# Patient Record
Sex: Female | Born: 1990 | Race: Black or African American | Hispanic: No | Marital: Single | Smoking: Never smoker
Health system: Southern US, Community
[De-identification: ages and names within clinical notes are randomized; demographics above are authoritative.]

## PROBLEM LIST (undated history)

## (undated) ENCOUNTER — Inpatient Hospital Stay (HOSPITAL_COMMUNITY): Payer: Self-pay

## (undated) DIAGNOSIS — O2343 Unspecified infection of urinary tract in pregnancy, third trimester: Secondary | ICD-10-CM

## (undated) DIAGNOSIS — B951 Streptococcus, group B, as the cause of diseases classified elsewhere: Secondary | ICD-10-CM

## (undated) DIAGNOSIS — O365990 Maternal care for other known or suspected poor fetal growth, unspecified trimester, not applicable or unspecified: Secondary | ICD-10-CM

## (undated) DIAGNOSIS — O98213 Gonorrhea complicating pregnancy, third trimester: Secondary | ICD-10-CM

## (undated) HISTORY — DX: Gonorrhea complicating pregnancy, third trimester: O98.213

## (undated) HISTORY — DX: Maternal care for other known or suspected poor fetal growth, unspecified trimester, not applicable or unspecified: O36.5990

## (undated) HISTORY — DX: Unspecified infection of urinary tract in pregnancy, third trimester: O23.43

## (undated) HISTORY — DX: Streptococcus, group b, as the cause of diseases classified elsewhere: B95.1

## (undated) HISTORY — PX: NO PAST SURGERIES: SHX2092

---

## 2013-08-10 NOTE — L&D Delivery Note (Signed)
Delivery Note At 11:45 PM a viable and healthy female was delivered via Vaginal, Spontaneous Delivery (Presentation:ROA).  Baby vigorously crying at delivery.   APGAR: 9 & 9 , ; weight pending .   Placenta status: Intact, Spontaneous.  Cord:  with the following complications: None.    Anesthesia: Epidural  Episiotomy: None Lacerations:  2nd degree Suture Repair: 3.0 monocryl Est. Blood Loss (mL):  300  Mom to postpartum.  Baby to Couplet care / Skin to Skin.  Rochele PagesKARIM, Ily Denno N 07/02/2014, 12:08 AM

## 2013-09-28 ENCOUNTER — Encounter: Payer: Self-pay | Admitting: Obstetrics & Gynecology

## 2013-09-28 ENCOUNTER — Ambulatory Visit (INDEPENDENT_AMBULATORY_CARE_PROVIDER_SITE_OTHER): Payer: Managed Care, Other (non HMO) | Admitting: Obstetrics & Gynecology

## 2013-09-28 VITALS — BP 109/70 | HR 86 | Temp 98.9°F | Ht 67.0 in | Wt 124.0 lb

## 2013-09-28 DIAGNOSIS — B9689 Other specified bacterial agents as the cause of diseases classified elsewhere: Secondary | ICD-10-CM

## 2013-09-28 DIAGNOSIS — A499 Bacterial infection, unspecified: Secondary | ICD-10-CM

## 2013-09-28 DIAGNOSIS — N760 Acute vaginitis: Secondary | ICD-10-CM

## 2013-09-28 LAB — POCT WET PREP (WET MOUNT): Clue Cells Wet Prep Whiff POC: POSITIVE

## 2013-09-28 MED ORDER — METRONIDAZOLE 500 MG PO TABS: 500.0000 mg | ORAL_TABLET | Freq: Two times a day (BID) | ORAL | Status: DC

## 2013-09-28 NOTE — Progress Notes (Signed)
Subjective:     Michele Torres is a 23 y.o. female here for a routine exam.  Current complaints: Patient in office today for a problem visit. Patient states she is having vaginal odor and white/mucous/yellow, thick/ watery discharge. Patient denies any itching, irritation, or burning. Patient states she is not currently trying to having a baby but thinks that she may having some fertility issues.  Personal health questionnaire reviewed: yes.   Gynecologic History Patient's last menstrual period was 09/13/2013. Contraception: None Last Pap: N/A.  Obstetric History OB History  Gravida Para Term Preterm AB SAB TAB Ectopic Multiple Living  0 0 0 0 0 0 0 0 0 0          The following portions of the patient's history were reviewed and updated as appropriate: allergies, current medications, past family history, past medical history, past social history, past surgical history and problem list.  Review of Systems Pertinent items are noted in HPI.    Objective:     Pelvic: thin, white discharge     Assessment:    Bacterial vaginosis   Plan:   Metronidazole Return prn

## 2013-09-28 NOTE — Patient Instructions (Signed)

## 2013-11-24 ENCOUNTER — Inpatient Hospital Stay (HOSPITAL_COMMUNITY)
Admission: AD | Admit: 2013-11-24 | Discharge: 2013-11-24 | Disposition: A | Discharge status: Home / Self Care | Payer: Managed Care, Other (non HMO) | Source: Ambulatory Visit | Attending: Obstetrics | Admitting: Obstetrics

## 2013-11-24 ENCOUNTER — Encounter (HOSPITAL_COMMUNITY): Payer: Self-pay | Admitting: *Deleted

## 2013-11-24 DIAGNOSIS — B9689 Other specified bacterial agents as the cause of diseases classified elsewhere: Secondary | ICD-10-CM | POA: Diagnosis not present

## 2013-11-24 DIAGNOSIS — O2390 Unspecified genitourinary tract infection in pregnancy, unspecified trimester: Secondary | ICD-10-CM | POA: Diagnosis not present

## 2013-11-24 DIAGNOSIS — O219 Vomiting of pregnancy, unspecified: Secondary | ICD-10-CM

## 2013-11-24 DIAGNOSIS — A499 Bacterial infection, unspecified: Secondary | ICD-10-CM | POA: Diagnosis not present

## 2013-11-24 DIAGNOSIS — O99330 Smoking (tobacco) complicating pregnancy, unspecified trimester: Secondary | ICD-10-CM | POA: Insufficient documentation

## 2013-11-24 DIAGNOSIS — N760 Acute vaginitis: Secondary | ICD-10-CM | POA: Diagnosis not present

## 2013-11-24 DIAGNOSIS — O210 Mild hyperemesis gravidarum: Secondary | ICD-10-CM | POA: Diagnosis present

## 2013-11-24 LAB — WET PREP, GENITAL
Trich, Wet Prep: NONE SEEN
Yeast Wet Prep HPF POC: NONE SEEN

## 2013-11-24 LAB — URINALYSIS, ROUTINE W REFLEX MICROSCOPIC
Bilirubin Urine: NEGATIVE
Glucose, UA: NEGATIVE mg/dL
Hgb urine dipstick: NEGATIVE
Ketones, ur: 15 mg/dL — AB
LEUKOCYTES UA: NEGATIVE
NITRITE: NEGATIVE
PH: 6.5 (ref 5.0–8.0)
Protein, ur: NEGATIVE mg/dL
SPECIFIC GRAVITY, URINE: 1.025 (ref 1.005–1.030)
UROBILINOGEN UA: 1 mg/dL (ref 0.0–1.0)

## 2013-11-24 LAB — POCT PREGNANCY, URINE: PREG TEST UR: POSITIVE — AB

## 2013-11-24 MED ORDER — PROMETHAZINE HCL 25 MG PO TABS
12.5000 mg | ORAL_TABLET | Freq: Once | ORAL | Status: AC
  Administered 2013-11-24: 12.5 mg via ORAL
  Filled 2013-11-24: qty 1

## 2013-11-24 MED ORDER — PROMETHAZINE HCL 12.5 MG PO TABS: 12.5000 mg | ORAL_TABLET | Freq: Four times a day (QID) | ORAL | Status: DC | PRN

## 2013-11-24 MED ORDER — METRONIDAZOLE 500 MG PO TABS: 500.0000 mg | ORAL_TABLET | Freq: Two times a day (BID) | ORAL | Status: DC

## 2013-11-24 NOTE — MAU Note (Signed)
Patient states she has had a positive home pregnancy test . States she has had nausea with occasional vomiting for about one week. Denies bleeding. Has slight vaginal discharge with slight odor.

## 2013-11-24 NOTE — Discharge Instructions (Signed)
Bacterial Vaginosis Bacterial vaginosis is an infection of the vagina. It happens when too many of certain germs (bacteria) grow in the vagina. HOME CARE  Take your medicine as told by your doctor.  Finish your medicine even if you start to feel better.  Do not have sex until you finish your medicine and are better.  Tell your sex partner that you have an infection. They should see their doctor for treatment.  Practice safe sex. Use condoms. Have only one sex partner. GET HELP IF:  You are not getting better after 3 days of treatment.  You have more grey fluid (discharge) coming from your vagina than before.  You have more pain than before.  You have a fever. MAKE SURE YOU:   Understand these instructions.  Will watch your condition.  Will get help right away if you are not doing well or get worse. Document Released: 05/05/2008 Document Revised: 05/17/2013 Document Reviewed: 03/08/2013 [facility] Patient Information 2014 [place], [place]. Pregnancy, First Trimester The first trimester is the first 3 months your baby is growing inside you. It is important to follow your doctor's instructions. HOME CARE   Do not smoke.  Do not drink alcohol.  Only take medicine as told by your doctor.  Exercise.  Eat healthy foods. Eat regular, well-balanced meals.  You can have sex (intercourse) if there are no other problems with the pregnancy.  Things that help with morning sickness:  Eat soda crackers before getting up in the morning.  Eat 4 to 5 small meals rather than 3 large meals.  Drink liquids between meals, not during meals.  Go to all appointments as told.  Take all vitamins or supplements as told by your doctor. GET HELP RIGHT AWAY IF:   You develop a fever.  You have a bad smelling fluid that is leaking from your vagina.  There is bleeding from the vagina.  You develop severe belly (abdominal) or back pain.  You throw up (vomit) blood. It may look like  coffee grounds.  You lose more than 2 pounds in a week.  You gain 5 pounds or more in a week.  You gain more than 2 pounds in a week and you see puffiness (swelling) in your feet, ankles, or legs.  You have severe dizziness or pass out (faint).  You are around people who have [place] measles, chickenpox, or fifth disease.  You have a headache, watery poop (diarrhea), pain with peeing (urinating), or cannot breath right. Document Released: 01/13/2008 Document Revised: 10/19/2011 Document Reviewed: 01/13/2008 [facility] Patient Information 2014 [place]ExitCare, [place]LL[place]. Hyperemesis Gravidarum Diet Hyperemesis gravidarum is a severe form of morning sickness. It is characterized by frequent and severe vomiting. It happens during the first trimester of pregnancy. It may be caused by the rapid hormone changes that happen during pregnancy. It is associated with a 5% weight loss of pre-pregnancy weight. The hyperemesis diet may be used to lessen symptoms of nausea and vomiting. EATING GUIDELINES  Eat 5 to 6 small meals daily instead of 3 large meals.  Avoid foods with strong smells.  Avoid drinking 30 minutes before and after meals.  Avoid fried or high-fat foods, such as butter and cream sauces.  Starchy foods are usually well-tolerated, such as cereal, toast, bread, potatoes, pasta, rice, and pretzels.  Eat crackers before you get out of bed in the morning.  Avoid spicy foods.  Ginger may help with nausea. Add  tsp ginger to hot tea or choose ginger tea.  Continue to  take your prenatal vitamins as directed by your caregiver. SAMPLE MEAL PLAN Breakfast    cup oatmeal  1 slice toast  1 tsp heart-healthy margarine  1 tsp jelly  1 scrambled egg Midmorning Snack   1 cup low-fat yogurt Lunch   Plain ham sandwich  Carrot or celery sticks  1 small apple  3 graham crackers Midafternoon Snack   Cheese and crackers Dinner  4 oz pork tenderloin  1 small baked potato  1 tsp  margarine   cup broccoli   cup grapes Evening Snack  1 cup pudding Document Released: 05/24/2007 Document Revised: 10/19/2011 Document Reviewed: 12/27/2012 ExitCare Patient Information 2014 [place]Care, [place].

## 2013-11-24 NOTE — MAU Provider Note (Signed)
History     CSN: [id number]  Arrival date and time: 11/24/13 1636   First Provider Initiated Contact with Patient 11/24/13 1816      Chief Complaint  Patient presents with  . Possible Pregnancy  . Nausea  . Vaginal Discharge   HPI Ms. Michele Torres is a 23 y.o. G1P0000 at 324w1d who presents to MAU today with complaint of N/V and vaginal discharge. She states she is unsure of her LMP but may have been in early February. She states she has noted a small amount of thin, white vaginal discharge with mild odor. She has also had frequent nausea with occasional vomiting. She denies abdominal pain or vaginal bleeding.   OB History   Grav Para Term Preterm Abortions TAB SAB Ect Mult Living   1 0 0 0 0 0 0 0 0 0       History reviewed. No pertinent past medical history.  History reviewed. No pertinent past surgical history.  Family History  Problem Relation Age of Onset  . Mental illness Maternal Grandmother     alzheimers    History  Substance Use Topics  . Smoking status: Current Some Day Smoker  . Smokeless tobacco: Never Used  . Alcohol Use: No    Allergies: No Known Allergies  Prescriptions prior to admission  Medication Sig Dispense Refill  . naproxen sodium (ANAPROX) 220 MG tablet Take 220 mg by mouth as needed (headache).        Review of Systems  Constitutional: Negative for fever.  Gastrointestinal: Positive for nausea and vomiting. Negative for abdominal pain, diarrhea and constipation.  Genitourinary:       Neg - vaginal bleeding + vaginal discharge   Physical Exam   Blood pressure 125/57, pulse 72, temperature 98.5 F (36.9 C), temperature source Oral, resp. rate 16, height 5\' 7"  (1.702 m), weight 54.341 kg (119 lb 12.8 oz), last menstrual period 09/14/2013, SpO2 100.00%.  Physical Exam  Constitutional: She is oriented to person, place, and time. She appears well-developed and well-nourished. No distress.  HENT:  Head: Normocephalic and atraumatic.   Cardiovascular: Normal rate.   Respiratory: Effort normal.  GI: Soft. She exhibits no distension and no mass. There is no tenderness. There is no rebound and no guarding.  Genitourinary: No bleeding around the vagina. Vaginal discharge (scant thin, white discharge noted) found.  Neurological: She is alert and oriented to person, place, and time.  Skin: Skin is warm and dry. No erythema.  Psychiatric: She has a normal mood and affect.   Results for orders placed during the hospital encounter of 11/24/13 (from the past 24 hour(s))  URINALYSIS, ROUTINE W REFLEX MICROSCOPIC     Status: Abnormal   Collection Time    11/24/13  5:10 PM      Result Value Ref Range   Color, Urine YELLOW  YELLOW   APPearance CLEAR  CLEAR   Specific Gravity, Urine 1.025  1.005 - 1.030   pH 6.5  5.0 - 8.0   Glucose, UA NEGATIVE  NEGATIVE mg/dL   Hgb urine dipstick NEGATIVE  NEGATIVE   Bilirubin Urine NEGATIVE  NEGATIVE   Ketones, ur 15 (*) NEGATIVE mg/dL   Protein, ur NEGATIVE  NEGATIVE mg/dL   Urobilinogen, UA 1.0  0.0 - 1.0 mg/dL   Nitrite NEGATIVE  NEGATIVE   Leukocytes, UA NEGATIVE  NEGATIVE  POCT PREGNANCY, URINE     Status: Abnormal   Collection Time    11/24/13  5:31 PM  Result Value Ref Range   Preg Test, Ur POSITIVE (*) NEGATIVE  WET PREP, GENITAL     Status: Abnormal   Collection Time    11/24/13  6:15 PM      Result Value Ref Range   Yeast Wet Prep HPF POC NONE SEEN  NONE SEEN   Trich, Wet Prep NONE SEEN  NONE SEEN   Clue Cells Wet Prep HPF POC FEW (*) NONE SEEN   WBC, Wet Prep HPF POC FEW (*) NONE SEEN    MAU Course  Procedures None  MDM + UPT UA, wet prep and GC/Chlamydia today 12.5 mg PO Phenergan given in MAU - patient reports improvement in symptoms  Assessment and Plan  A: Nausea and vomiting in pregnancy prior to [redacted] weeks gestation Bacterial vaginosis  P: Discharge home Rx for Flagyl and Phenergan sent to patient's pharmacy Pregnancy confirmation letter  given Patient given information about hyperemesis diet on AVS Encouraged increased PO hydration as tolerated First trimester warning signs discussed Patient advised to make an appointment with [facility] ASAP to start prenatal care Patient may return to MAU as needed or if her condition were to change or worsen  Freddi StarrJulie N Ethier, PA-C  11/24/2013, 6:49 PM

## 2013-11-25 LAB — GC/CHLAMYDIA PROBE AMP
CT Probe RNA: NEGATIVE
GC Probe RNA: NEGATIVE

## 2013-11-27 ENCOUNTER — Encounter (HOSPITAL_COMMUNITY): Payer: Self-pay | Admitting: *Deleted

## 2013-11-27 ENCOUNTER — Inpatient Hospital Stay (HOSPITAL_COMMUNITY)
Admission: AD | Admit: 2013-11-27 | Discharge: 2013-11-27 | Disposition: A | Discharge status: Home / Self Care | Payer: Managed Care, Other (non HMO) | Source: Ambulatory Visit | Attending: Obstetrics | Admitting: Obstetrics

## 2013-11-27 DIAGNOSIS — O99330 Smoking (tobacco) complicating pregnancy, unspecified trimester: Secondary | ICD-10-CM | POA: Insufficient documentation

## 2013-11-27 DIAGNOSIS — O210 Mild hyperemesis gravidarum: Secondary | ICD-10-CM | POA: Diagnosis present

## 2013-11-27 DIAGNOSIS — O219 Vomiting of pregnancy, unspecified: Secondary | ICD-10-CM

## 2013-11-27 LAB — URINALYSIS, ROUTINE W REFLEX MICROSCOPIC
Bilirubin Urine: NEGATIVE
GLUCOSE, UA: NEGATIVE mg/dL
Hgb urine dipstick: NEGATIVE
Ketones, ur: 15 mg/dL — AB
Nitrite: NEGATIVE
PH: 6 (ref 5.0–8.0)
Protein, ur: 30 mg/dL — AB
SPECIFIC GRAVITY, URINE: 1.02 (ref 1.005–1.030)
Urobilinogen, UA: 1 mg/dL (ref 0.0–1.0)

## 2013-11-27 LAB — URINE MICROSCOPIC-ADD ON

## 2013-11-27 MED ORDER — ONDANSETRON HCL 4 MG PO TABS: 4.0000 mg | ORAL_TABLET | Freq: Four times a day (QID) | ORAL | Status: DC

## 2013-11-27 MED ORDER — ONDANSETRON 8 MG PO TBDP
8.0000 mg | ORAL_TABLET | Freq: Once | ORAL | Status: AC
  Administered 2013-11-27: 8 mg via ORAL
  Filled 2013-11-27: qty 1

## 2013-11-27 NOTE — MAU Provider Note (Signed)
History     CSN: [id number]  Arrival date and time: 11/27/13 1113   First Provider Initiated Contact with Patient 11/27/13 1131      Chief Complaint  Patient presents with  . Morning Sickness   HPI Michele Torres is a 23 y.o. G1P0000 at 6466w4d who presents to MAU today with complaint of N/V. The patient states that she has continued to have frequent nausea and occasional vomiting. She states she has had more vomiting today because she was unable to take the Phenergan because of the drowsiness and she had a test at school. She denies abdominal pain, vaginal bleeding or fever.   OB History   Grav Para Term Preterm Abortions TAB SAB Ect Mult Living   1 0 0 0 0 0 0 0 0 0       History reviewed. No pertinent past medical history.  History reviewed. No pertinent past surgical history.  Family History  Problem Relation Age of Onset  . Mental illness Maternal Grandmother     alzheimers    History  Substance Use Topics  . Smoking status: Current Some Day Smoker  . Smokeless tobacco: Never Used  . Alcohol Use: No    Allergies: No Known Allergies  Prescriptions prior to admission  Medication Sig Dispense Refill  . metroNIDAZOLE (FLAGYL) 500 MG tablet Take 1 tablet (500 mg total) by mouth 2 (two) times daily.  14 tablet  0  . promethazine (PHENERGAN) 12.5 MG tablet Take 1 tablet (12.5 mg total) by mouth every 6 (six) hours as needed for nausea or vomiting.  30 tablet  0    Review of Systems  Constitutional: Negative for fever and malaise/fatigue.  Gastrointestinal: Positive for nausea and vomiting. Negative for abdominal pain.  Genitourinary:       Neg -vaginal bleeding  Neurological: Positive for weakness. Negative for dizziness.   Physical Exam   Blood pressure 128/67, pulse 72, temperature 98.4 F (36.9 C), resp. rate 18, height 5\' 7"  (1.702 m), weight 54.432 kg (120 lb), last menstrual period 09/14/2013.  Physical Exam  Constitutional: She is oriented to  person, place, and time. She appears well-developed and well-nourished. No distress.  HENT:  Head: Normocephalic and atraumatic.  Cardiovascular: Normal rate.   Respiratory: Effort normal.  GI: Soft. She exhibits no distension and no mass. There is no tenderness. There is no rebound and no guarding.  Neurological: She is alert and oriented to person, place, and time.  Skin: Skin is warm and dry. No erythema.  Psychiatric: She has a normal mood and affect.   Results for orders placed during the hospital encounter of 11/27/13 (from the past 24 hour(s))  URINALYSIS, ROUTINE W REFLEX MICROSCOPIC     Status: Abnormal   Collection Time    11/27/13 11:21 AM      Result Value Ref Range   Color, Urine YELLOW  YELLOW   APPearance CLEAR  CLEAR   Specific Gravity, Urine 1.020  1.005 - 1.030   pH 6.0  5.0 - 8.0   Glucose, UA NEGATIVE  NEGATIVE mg/dL   Hgb urine dipstick NEGATIVE  NEGATIVE   Bilirubin Urine NEGATIVE  NEGATIVE   Ketones, ur 15 (*) NEGATIVE mg/dL   Protein, ur 30 (*) NEGATIVE mg/dL   Urobilinogen, UA 1.0  0.0 - 1.0 mg/dL   Nitrite NEGATIVE  NEGATIVE   Leukocytes, UA TRACE (*) NEGATIVE  URINE MICROSCOPIC-ADD ON     Status: None   Collection Time    11/27/13 11:21  AM      Result Value Ref Range   Squamous Epithelial / LPF RARE  RARE   WBC, UA 0-2  <3 WBC/hpf   Bacteria, UA RARE  RARE    MAU Course  Procedures None  MDM Discussed risk vs benefits of Zofran. Patient would like to try it today 8 mg ODT Zofran given - patient reports significant improvement in nausea  Assessment and Plan  A: Nausea and vomiting in pregnancy prior to [redacted] weeks gestation  P: Discharge home Rx for Zofran sent to patient's pharmacy Patient advised of Hyperemesis diet Discussed foods to avoid with nausea Patient advised to call [facility] for an appointment to start prenatal care ASAP Patient may return to MAU as needed or if her condition were to change or worsen  Freddi StarrJulie N Ethier, PA-C   11/27/2013, 12:40 PM

## 2013-11-27 NOTE — Discharge Instructions (Signed)
Hyperemesis Gravidarum Diet °Hyperemesis gravidarum is a severe form of morning sickness. It is characterized by frequent and severe vomiting. It happens during the first trimester of pregnancy. It may be caused by the rapid hormone changes that happen during pregnancy. It is associated with a 5% weight loss of pre-pregnancy weight. The hyperemesis diet may be used to lessen symptoms of nausea and vomiting. °EATING GUIDELINES °· Eat 5 to 6 small meals daily instead of 3 large meals. °· Avoid foods with strong smells. °· Avoid drinking 30 minutes before and after meals. °· Avoid fried or high-fat foods, such as butter and cream sauces. °· Starchy foods are usually well-tolerated, such as cereal, toast, bread, potatoes, pasta, rice, and pretzels. °· Eat crackers before you get out of bed in the morning. °· Avoid spicy foods. °· Ginger may help with nausea. Add ¼ tsp ginger to hot tea or choose ginger tea. °· Continue to take your prenatal vitamins as directed by your caregiver. °SAMPLE MEAL PLAN °Breakfast  °· ½ cup oatmeal °· 1 slice toast °· 1 tsp heart-healthy margarine °· 1 tsp jelly °· 1 scrambled egg °Midmorning Snack  °· 1 cup low-fat yogurt °Lunch  °· Plain ham sandwich °· Carrot or celery sticks °· 1 small apple °· 3 graham crackers °Midafternoon Snack  °· Cheese and crackers °Dinner °· 4 oz pork tenderloin °· 1 small baked potato °· 1 tsp margarine °· ½ cup broccoli °· ½ cup grapes °Evening Snack °· 1 cup pudding °Document Released: 05/24/2007 Document Revised: 10/19/2011 Document Reviewed: 12/27/2012 °ExitCare® Patient Information ©2014 ExitCare[employer] LLC. ° °

## 2013-11-27 NOTE — MAU Note (Signed)
Pt presents to MAU for complaints of nausea and vomiting. She says she was given phenergan on Friday when she was evaluated here in MAU.  The medication makes her sleepy and she sleeps but when she wakes up she is still nauseous. She was also given Flagyl for BV and states that may be causing her to be nauseated.

## 2013-12-01 ENCOUNTER — Inpatient Hospital Stay (HOSPITAL_COMMUNITY)
Admission: AD | Admit: 2013-12-01 | Discharge: 2013-12-01 | Disposition: A | Discharge status: Home / Self Care | Payer: Managed Care, Other (non HMO) | Source: Ambulatory Visit | Attending: Obstetrics & Gynecology | Admitting: Obstetrics & Gynecology

## 2013-12-01 ENCOUNTER — Encounter (HOSPITAL_COMMUNITY): Payer: Self-pay | Admitting: *Deleted

## 2013-12-01 DIAGNOSIS — N898 Other specified noninflammatory disorders of vagina: Secondary | ICD-10-CM

## 2013-12-01 DIAGNOSIS — B3731 Acute candidiasis of vulva and vagina: Secondary | ICD-10-CM | POA: Diagnosis not present

## 2013-12-01 DIAGNOSIS — O2390 Unspecified genitourinary tract infection in pregnancy, unspecified trimester: Secondary | ICD-10-CM | POA: Insufficient documentation

## 2013-12-01 DIAGNOSIS — B373 Candidiasis of vulva and vagina: Secondary | ICD-10-CM | POA: Insufficient documentation

## 2013-12-01 DIAGNOSIS — O99330 Smoking (tobacco) complicating pregnancy, unspecified trimester: Secondary | ICD-10-CM | POA: Diagnosis not present

## 2013-12-01 DIAGNOSIS — O26891 Other specified pregnancy related conditions, first trimester: Secondary | ICD-10-CM

## 2013-12-01 LAB — WET PREP, GENITAL
Clue Cells Wet Prep HPF POC: NONE SEEN
Trich, Wet Prep: NONE SEEN

## 2013-12-01 MED ORDER — TERCONAZOLE 0.4 % VA CREA: 1.0000 | TOPICAL_CREAM | Freq: Every day | VAGINAL | Status: DC

## 2013-12-01 NOTE — MAU Note (Signed)
Pt reports she was seen here one week ago and was told she had bacterial vaginosis and given meds but it is getting worse.

## 2013-12-01 NOTE — MAU Provider Note (Signed)
Chief Complaint: Vaginal Discharge   First Provider Initiated Contact with Patient 12/01/13 0308     SUBJECTIVE HPI: Michele Torres is a 23 y.o. G1P0000 at 6558w1d by LMP who presents with possible bacterial vaginosis. States she was seen in maternity admissions one week ago diagnosed with bacterial vaginosis and Flagyl as directed. States discharge never went away. Call office regarding symptoms, but did not call back. Denies fever, chills, vaginal bleeding, vaginal odor, abdominal pain. Vaginal discharge associated with itching and irritation.  History reviewed. No pertinent past medical history. OB History  Gravida Para Term Preterm AB SAB TAB Ectopic Multiple Living  1 0 0 0 0 0 0 0 0 0     # Outcome Date GA Lbr Len/2nd Weight Sex Delivery Anes PTL Lv  1 CUR              History reviewed. No pertinent past surgical history. History   Social History  . Marital Status: Single    Spouse Name: N/A    Number of Children: N/A  . Years of Education: N/A   Occupational History  . Not on file.   Social History Main Topics  . Smoking status: Current Some Day Smoker  . Smokeless tobacco: Never Used  . Alcohol Use: No  . Drug Use: No  . Sexual Activity: Yes    Partners: Male    Birth Control/ Protection: None   Other Topics Concern  . Not on file   Social History Narrative  . No narrative on file   No current facility-administered medications on file prior to encounter.   Current Outpatient Prescriptions on File Prior to Encounter  Medication Sig Dispense Refill  . metroNIDAZOLE (FLAGYL) 500 MG tablet Take 1 tablet (500 mg total) by mouth 2 (two) times daily.  14 tablet  0  . ondansetron (ZOFRAN) 4 MG tablet Take 1 tablet (4 mg total) by mouth every 6 (six) hours.  12 tablet  0  . promethazine (PHENERGAN) 12.5 MG tablet Take 1 tablet (12.5 mg total) by mouth every 6 (six) hours as needed for nausea or vomiting.  30 tablet  0   No Known Allergies  ROS: Pertinent items in  HPI  OBJECTIVE Blood pressure 124/63, pulse 74, temperature 98.2 F (36.8 C), temperature source Oral, resp. rate 16, height 5\' 7"  (1.702 m), weight 56.7 kg (125 lb), last menstrual period 09/14/2013, SpO2 100.00%. GENERAL: Well-developed, well-nourished female in no acute distress.  HEENT: Normocephalic HEART: normal rate RESP: normal effort ABDOMEN: Soft, non-tender EXTREMITIES: Nontender, no edema NEURO: Alert and oriented SPECULUM EXAM: NEFG, large amount of white, curd-like, odorless discharge, no blood noted, cervix clean BIMANUAL: cervix closed; uterus 12-week size, no adnexal tenderness or masses. Fetal heart rate 158 by Doppler.  LAB RESULTS Results for orders placed during the hospital encounter of 12/01/13 (from the past 168 hour(s))  WET PREP, GENITAL   Collection Time    12/01/13  3:05 AM      Result Value Ref Range   Yeast Wet Prep HPF POC FEW (*) NONE SEEN   Trich, Wet Prep NONE SEEN  NONE SEEN   Clue Cells Wet Prep HPF POC NONE SEEN  NONE SEEN   WBC, Wet Prep HPF POC MODERATE (*) NONE SEEN    IMAGING No results found.  MAU COURSE  ASSESSMENT 1. Vaginal yeast infection   2. Vaginal discharge in pregnancy in first trimester    PLAN Discharge home in stable condition. GC/Chlamydia cultures deferred due to negative/negative on  11/24/2013.      Follow-up Information   Follow up with Roseanna RainbowJACKSON-MOORE,LISA A, MD. (As scheduled or as needed if symptoms worsen)    Specialty:  Obstetrics and Gynecology   Contact information:   [address] [place] [address] [phone]       [facility]. (As needed in emergencies)    Contact information:   [address] [id number] [place] [address] [phone]       Medication List    STOP taking these medications       metroNIDAZOLE 500 MG tablet  Commonly known as:  FLAGYL      TAKE these medications        ondansetron 4 MG tablet  Commonly known as:  ZOFRAN  Take 1 tablet (4 mg total) by mouth every 6 (six) hours.     prenatal multivitamin Tabs tablet  Take 1 tablet by mouth daily at 12 noon.     promethazine 12.5 MG tablet  Commonly known as:  PHENERGAN  Take 1 tablet (12.5 mg total) by mouth every 6 (six) hours as needed for nausea or vomiting.     terconazole 0.4 % vaginal cream  Commonly known as:  TERAZOL 7  Place 1 applicator vaginally at bedtime.        [place] Tit[place] Torres, CNM 12/01/2013  3:15 AM

## 2013-12-01 NOTE — Discharge Instructions (Signed)
Candidal Vulvovaginitis  Candidal vulvovaginitis is an infection of the vagina and vulva. The vulva is the skin around the opening of the vagina. This may cause itching and discomfort in and around the vagina.   HOME CARE  · Only take medicine as told by your doctor.  · Do not have sex (intercourse) until the infection is healed or as told by your doctor.  · Practice safe sex.  · Tell your sex partner about your infection.  · Do not douche or use tampons.  · Wear cotton underwear. Do not wear tight pants or panty hose.  · Eat yogurt. This may help treat and prevent yeast infections.  GET HELP RIGHT AWAY IF:   · You have a fever.  · Your problems get worse during treatment or do not get better in 3 days.  · You have discomfort, irritation, or itching in your vagina or vulva area.  · You have pain after sex.  · You start to get belly (abdominal) pain.  MAKE SURE YOU:  · Understand these instructions.  · Will watch your condition.  · Will get help right away if you are not doing well or get worse.  Document Released: 10/23/2008 Document Revised: 10/19/2011 Document Reviewed: 10/23/2008  ExitCare® Patient Information ©2014 ExitCare, LLC.

## 2013-12-07 ENCOUNTER — Other Ambulatory Visit: Payer: Self-pay | Admitting: *Deleted

## 2013-12-07 DIAGNOSIS — O219 Vomiting of pregnancy, unspecified: Secondary | ICD-10-CM

## 2013-12-07 MED ORDER — ONDANSETRON 4 MG PO TBDP: 4.0000 mg | ORAL_TABLET | Freq: Three times a day (TID) | ORAL | Status: DC | PRN

## 2013-12-08 ENCOUNTER — Encounter: Payer: Self-pay | Admitting: Obstetrics

## 2014-05-03 ENCOUNTER — Inpatient Hospital Stay (HOSPITAL_COMMUNITY)
Admission: AD | Admit: 2014-05-03 | Discharge: 2014-05-03 | Disposition: A | Discharge status: Home / Self Care | Payer: Medicaid Other | Source: Ambulatory Visit | Attending: Family Medicine | Admitting: Family Medicine

## 2014-05-03 ENCOUNTER — Encounter (HOSPITAL_COMMUNITY): Payer: Self-pay | Admitting: *Deleted

## 2014-05-03 DIAGNOSIS — O99330 Smoking (tobacco) complicating pregnancy, unspecified trimester: Secondary | ICD-10-CM | POA: Diagnosis not present

## 2014-05-03 DIAGNOSIS — J3489 Other specified disorders of nose and nasal sinuses: Secondary | ICD-10-CM | POA: Diagnosis not present

## 2014-05-03 DIAGNOSIS — J069 Acute upper respiratory infection, unspecified: Secondary | ICD-10-CM

## 2014-05-03 DIAGNOSIS — O212 Late vomiting of pregnancy: Secondary | ICD-10-CM

## 2014-05-03 DIAGNOSIS — O99891 Other specified diseases and conditions complicating pregnancy: Secondary | ICD-10-CM | POA: Insufficient documentation

## 2014-05-03 DIAGNOSIS — O9989 Other specified diseases and conditions complicating pregnancy, childbirth and the puerperium: Principal | ICD-10-CM

## 2014-05-03 MED ORDER — DIPHENHYDRAMINE HCL 25 MG PO TABS: 25.0000 mg | ORAL_TABLET | Freq: Every evening | ORAL | Status: DC | PRN

## 2014-05-03 MED ORDER — PSEUDOEPHEDRINE HCL 60 MG PO TABS: 60.0000 mg | ORAL_TABLET | ORAL | Status: DC | PRN

## 2014-05-03 MED ORDER — SALINE SPRAY 0.65 % NA SOLN: 1.0000 | NASAL | Status: DC | PRN

## 2014-05-03 NOTE — MAU Note (Signed)
Does not have a provider here, recently moved back.

## 2014-05-03 NOTE — MAU Note (Signed)
Has come down with a cold, last few days.  Started throwing up again yesterday. + drainage, gagging her, denies fever or diarrhea.

## 2014-05-03 NOTE — MAU Provider Note (Signed)
Attestation of Attending Supervision of Advanced Practitioner (PA/CNM/NP): Evaluation and management procedures were performed by the Advanced Practitioner under my supervision and collaboration.  I have reviewed the Advanced Practitioner's note and chart, and I agree with the management and plan.  Marsa Matteo S, MD [facility] Attending 05/03/2014 11:06 PM   

## 2014-05-03 NOTE — Discharge Instructions (Signed)
Upper Respiratory Infection, Adult An upper respiratory infection (URI) is also sometimes known as the common cold. The upper respiratory tract includes the nose, sinuses, throat, trachea, and bronchi. Bronchi are the airways leading to the lungs. Most people improve within 1 week, but symptoms can last up to 2 weeks. A residual cough may last even longer.  CAUSES Many different viruses can infect the tissues lining the upper respiratory tract. The tissues become irritated and inflamed and often become very moist. Mucus production is also common. A cold is contagious. You can easily spread the virus to others by oral contact. This includes kissing, sharing a glass, coughing, or sneezing. Touching your mouth or nose and then touching a surface, which is then touched by another person, can also spread the virus. SYMPTOMS  Symptoms typically develop 1 to 3 days after you come in contact with a cold virus. Symptoms vary from person to person. They may include:  Runny nose.  Sneezing.  Nasal congestion.  Sinus irritation.  Sore throat.  Loss of voice (laryngitis).  Cough.  Fatigue.  Muscle aches.  Loss of appetite.  Headache.  Low-grade fever. DIAGNOSIS  You might diagnose your own cold based on familiar symptoms, since most people get a cold 2 to 3 times a year. Your caregiver can confirm this based on your exam. Most importantly, your caregiver can check that your symptoms are not due to another disease such as strep throat, sinusitis, pneumonia, asthma, or epiglottitis. Blood tests, throat tests, and X-rays are not necessary to diagnose a common cold, but they may sometimes be helpful in excluding other more serious diseases. Your caregiver will decide if any further tests are required. RISKS AND COMPLICATIONS  You may be at risk for a more severe case of the common cold if you smoke cigarettes, have chronic heart disease (such as heart failure) or lung disease (such as asthma), or if  you have a weakened immune system. The very young and very old are also at risk for more serious infections. Bacterial sinusitis, middle ear infections, and bacterial pneumonia can complicate the common cold. The common cold can worsen asthma and chronic obstructive pulmonary disease (COPD). Sometimes, these complications can require emergency medical care and may be life-threatening. PREVENTION  The best way to protect against getting a cold is to practice good hygiene. Avoid oral or hand contact with people with cold symptoms. Wash your hands often if contact occurs. There is no clear evidence that vitamin C, vitamin E, echinacea, or exercise reduces the chance of developing a cold. However, it is always recommended to get plenty of rest and practice good nutrition. TREATMENT  Treatment is directed at relieving symptoms. There is no cure. Antibiotics are not effective, because the infection is caused by a virus, not by bacteria. Treatment may include:  Increased fluid intake. Sports drinks offer valuable electrolytes, sugars, and fluids.  Breathing heated mist or steam (vaporizer or shower).  Eating chicken soup or other clear broths, and maintaining good nutrition.  Getting plenty of rest.  Using gargles or lozenges for comfort.  Controlling fevers with ibuprofen or acetaminophen as directed by your caregiver.  Increasing usage of your inhaler if you have asthma. Zinc gel and zinc lozenges, taken in the first 24 hours of the common cold, can shorten the duration and lessen the severity of symptoms. Pain medicines may help with fever, muscle aches, and throat pain. A variety of non-prescription medicines are available to treat congestion and runny nose. Your caregiver   can make recommendations and may suggest nasal or lung inhalers for other symptoms.  HOME CARE INSTRUCTIONS   Only take over-the-counter or prescription medicines for pain, discomfort, or fever as directed by your  caregiver.  Use a warm mist humidifier or inhale steam from a shower to increase air moisture. This may keep secretions moist and make it easier to breathe.  Drink enough water and fluids to keep your urine clear or pale yellow.  Rest as needed.  Return to work when your temperature has returned to normal or as your caregiver advises. You may need to stay home longer to avoid infecting others. You can also use a face mask and careful hand washing to prevent spread of the virus. SEEK MEDICAL CARE IF:   After the first few days, you feel you are getting worse rather than better.  You need your caregiver's advice about medicines to control symptoms.  You develop chills, worsening shortness of breath, or brown or red sputum. These may be signs of pneumonia.  You develop yellow or brown nasal discharge or pain in the face, especially when you bend forward. These may be signs of sinusitis.  You develop a fever, swollen neck glands, pain with swallowing, or white areas in the back of your throat. These may be signs of strep throat. SEEK IMMEDIATE MEDICAL CARE IF:   You have a fever.  You develop severe or persistent headache, ear pain, sinus pain, or chest pain.  You develop wheezing, a prolonged cough, cough up blood, or have a change in your usual mucus (if you have chronic lung disease).  You develop sore muscles or a stiff neck. Document Released: 01/20/2001 Document Revised: 10/19/2011 Document Reviewed: 11/01/2013 Exit[employer] Patient Information 2015 [employer]. This information is not intended to replace advice given to you by your health care provider. Make sure you discuss any questions you have with your health care provider.  

## 2014-05-03 NOTE — MAU Note (Signed)
Urine in lab 

## 2014-05-03 NOTE — MAU Note (Signed)
Left sided Headache, earache, and  sore throat as well as congested left side of nasal passage.  Pt tried 2 estra strength tylenol last night which helped with her headache.

## 2014-05-03 NOTE — MAU Provider Note (Signed)
History    CSN: [id number]  Arrival date and time: 05/03/14 1248     Chief Complaint  Patient presents with  . URI   HPI   Michele Torres is a 23 y.o. G1P0000 [redacted]w[redacted]d female presenting to [facility] complaining of N/V, cough, nasal draniage and sore throat.  She also reports h/a x3 days.  She states the sore throat is worse in the morning and improves as the day goes on. The headaches are relieved with tylenol but they come back the next day. She describes the headaches as pressure and pain. The pt states that the cough occasionally produces yellow sputum. She also says she vomited yesterday afternoon and has felt nauseous since. She feels the nasal drainage may be causing her nausea. She noticed that drinking carbonated drinks like coca-cola makes the nausea worse but that nothing alleviates the symptoms.   She reports good fetal movement, denies LOF, vaginal bleeding, vaginal itching/burning, urinary symptoms, dizziness, or fever/chills.    OB History   Grav Para Term Preterm Abortions TAB SAB Ect Mult Living        History reviewed. No pertinent past medical history.  History reviewed. No pertinent past surgical history.  Family History  Problem Relation Age of Onset  . Mental illness Maternal Grandmother     alzheimers    History  Substance Use Topics  . Smoking status: Current Some Day Smoker  . Smokeless tobacco: Never Used  . Alcohol Use: No    Allergies: No Known Allergies  Prescriptions prior to admission  Medication Sig Dispense Refill  . acetaminophen (TYLENOL) 500 MG tablet Take 1,000 mg by mouth daily as needed.      . calcium carbonate (TUMS - DOSED IN MG ELEMENTAL CALCIUM) 500 MG chewable tablet Chew 1 tablet by mouth daily as needed for indigestion or heartburn.      . diphenhydrAMINE (BENADRYL) 12.5 MG/5ML liquid Take 25 mg by mouth 4 (four) times daily as needed for allergies (and for cold symptoms).      . Prenatal Vit-Fe  Fumarate-FA (PRENATAL MULTIVITAMIN) TABS tablet Take 1 tablet by mouth daily at 12 noon.        Review of Systems  Constitutional: Positive for diaphoresis. Negative for fever, chills, weight loss and malaise/fatigue.  HENT: Positive for congestion, ear pain and sore throat. Negative for ear discharge, hearing loss and nosebleeds.   Eyes: Negative for blurred vision.  Respiratory: Positive for cough. Negative for hemoptysis and shortness of breath.   Cardiovascular: Negative for chest pain and palpitations.  Gastrointestinal: Positive for heartburn, nausea and vomiting. Negative for abdominal pain, diarrhea and constipation.  Genitourinary: Negative for dysuria, urgency and frequency.  Skin: Negative for rash.  Neurological: Positive for headaches. Negative for dizziness and weakness.  Psychiatric/Behavioral: Negative for depression, suicidal ideas, hallucinations and substance abuse.   Physical Exam   Blood pressure 143/72, pulse 98, temperature 98 F (36.7 C), temperature source Oral, resp. rate 20, height 5' 5.25" (1.657 m), weight 71.668 kg (158 lb), last menstrual period 09/14/2013, SpO2 100.00%. Patient Vitals for the past 24 hrs:  BP Temp Temp src Pulse Resp SpO2 Height Weight  05/03/14 1540 128/81 mmHg 97.9 F (36.6 C) Oral 87 - - - -  05/03/14 1319 143/72 mmHg 98 F (36.7 C) Oral 98 20 100 % 5' 5.25" (1.657 m) 71.668 kg (158 lb)   Physical Exam  Constitutional: She is oriented to person, place, and  time. She appears well-developed and well-nourished. No distress.  HENT:  Head: Normocephalic and atraumatic.  Right Ear: Tympanic membrane, external ear and ear canal normal. No tenderness. No foreign bodies. Tympanic membrane is not bulging. No middle ear effusion.  Left Ear: Tympanic membrane, external ear and ear canal normal. No tenderness. No foreign bodies. Tympanic membrane is not bulging.  No middle ear effusion.  Nose: No sinus tenderness. Right sinus exhibits no  maxillary sinus tenderness and no frontal sinus tenderness. Left sinus exhibits no maxillary sinus tenderness and no frontal sinus tenderness.  Mouth/Throat: Uvula is midline. Posterior oropharyngeal erythema (left > right) present.  Nasal mucosa erythematous.  Eyes: EOM are normal.  Neck: Normal range of motion. Neck supple.  Cardiovascular: Normal rate, regular rhythm and normal heart sounds.   Respiratory: Effort normal and breath sounds normal. She has no wheezes. She has no rales.  Lymphadenopathy:    She has no cervical adenopathy.  Neurological: She is alert and oriented to person, place, and time.   FHR baseline 135 with positive accels, no decels and moderate variability No contractions on toco or to palpation  MAU Course  Procedures  MDM Pt condition stable during MAU course  Assessment and Plan  Pt discharged home Pseudoephedrine 30-60 mg Q4H for nasal congestion Benadryl 25-50 QHS at night for sinus relief Saline nasal Spray PRN Increase PO fluids Pt given list of local providers to establish prenatal care in the area Return to MAU as needed if symptoms persist or worsen  Rosemary Holms 05/03/2014, 2:47 PM   I have seen this patient and agree with the above PA student's note.  LEFTWICH-KIRBY, Anajah Sterbenz Certified Nurse-Midwife

## 2014-06-06 ENCOUNTER — Encounter: Payer: Medicaid Other | Admitting: Family Medicine

## 2014-06-11 ENCOUNTER — Encounter (HOSPITAL_COMMUNITY): Payer: Self-pay | Admitting: *Deleted

## 2014-06-12 ENCOUNTER — Ambulatory Visit (INDEPENDENT_AMBULATORY_CARE_PROVIDER_SITE_OTHER): Payer: Medicaid Other | Admitting: Obstetrics & Gynecology

## 2014-06-12 ENCOUNTER — Other Ambulatory Visit: Payer: Self-pay | Admitting: Obstetrics & Gynecology

## 2014-06-12 ENCOUNTER — Encounter: Payer: Self-pay | Admitting: Obstetrics & Gynecology

## 2014-06-12 VITALS — BP 130/80 | HR 92 | Wt 165.0 lb

## 2014-06-12 DIAGNOSIS — O2343 Unspecified infection of urinary tract in pregnancy, third trimester: Secondary | ICD-10-CM

## 2014-06-12 DIAGNOSIS — O98213 Gonorrhea complicating pregnancy, third trimester: Secondary | ICD-10-CM

## 2014-06-12 DIAGNOSIS — O0933 Supervision of pregnancy with insufficient antenatal care, third trimester: Secondary | ICD-10-CM

## 2014-06-12 DIAGNOSIS — B951 Streptococcus, group B, as the cause of diseases classified elsewhere: Secondary | ICD-10-CM

## 2014-06-12 DIAGNOSIS — Z3490 Encounter for supervision of normal pregnancy, unspecified, unspecified trimester: Secondary | ICD-10-CM

## 2014-06-12 DIAGNOSIS — Z23 Encounter for immunization: Secondary | ICD-10-CM

## 2014-06-12 DIAGNOSIS — O365931 Maternal care for other known or suspected poor fetal growth, third trimester, fetus 1: Secondary | ICD-10-CM

## 2014-06-12 LAB — OB RESULTS CONSOLE GC/CHLAMYDIA
CHLAMYDIA, DNA PROBE: NEGATIVE
Gonorrhea: POSITIVE

## 2014-06-12 LAB — OB RESULTS CONSOLE GBS: GBS: POSITIVE

## 2014-06-12 MED ORDER — TETANUS-DIPHTH-ACELL PERTUSSIS 5-2.5-18.5 LF-MCG/0.5 IM SUSP: 0.5000 mL | Freq: Once | INTRAMUSCULAR | Status: DC

## 2014-06-12 NOTE — Progress Notes (Signed)
[facility]

## 2014-06-12 NOTE — Progress Notes (Signed)
[facility] [place] (ROI signed to obtain records) Reports going there since early pregnancy. Had 1 hr GTT then another visit prior to today.  Had ultrasound in office that provided her EDC of 07/11/14; reports not having formal anatomy scan. Did not get pelvic cultures. Declines flu shot, wants Tdap, ordered.  Prenatal profile ordered. Pelvic cultures obtained. Reports negative SS screen. Ultrasound ordered given size >>> dates, will follow up results and manage accordingly. No other complaints or concerns.  Labor and fetal movement precautions reviewed.

## 2014-06-12 NOTE — Patient Instructions (Signed)
Third Trimester of Pregnancy The third trimester is from week 29 through week 42, months 7 through 9. The third trimester is a time when the fetus is growing rapidly. At the end of the ninth month, the fetus is about 20 inches in length and weighs 6-10 pounds.  BODY CHANGES Your body goes through many changes during pregnancy. The changes vary from woman to woman.   Your weight will continue to increase. You can expect to gain 25-35 pounds (11-16 kg) by the end of the pregnancy.  You may begin to get stretch marks on your hips, abdomen, and breasts.  You may urinate more often because the fetus is moving lower into your pelvis and pressing on your bladder.  You may develop or continue to have heartburn as a result of your pregnancy.  You may develop constipation because certain hormones are causing the muscles that push waste through your intestines to slow down.  You may develop hemorrhoids or swollen, bulging veins (varicose veins).  You may have pelvic pain because of the weight gain and pregnancy hormones relaxing your joints between the bones in your pelvis. Backaches may result from overexertion of the muscles supporting your posture.  You may have changes in your hair. These can include thickening of your hair, rapid growth, and changes in texture. Some women also have hair loss during or after pregnancy, or hair that feels dry or thin. Your hair will most likely return to normal after your baby is born.  Your breasts will continue to grow and be tender. A yellow discharge may leak from your breasts called colostrum.  Your belly button may stick out.  You may feel short of breath because of your expanding uterus.  You may notice the fetus "dropping," or moving lower in your abdomen.  You may have a bloody mucus discharge. This usually occurs a few days to a week before labor begins.  Your cervix becomes thin and soft (effaced) near your due date. WHAT TO EXPECT AT YOUR PRENATAL  EXAMS  You will have prenatal exams every 2 weeks until week 36. Then, you will have weekly prenatal exams. During a routine prenatal visit:  You will be weighed to make sure you and the fetus are growing normally.  Your blood pressure is taken.  Your abdomen will be measured to track your baby's growth.  The fetal heartbeat will be listened to.  Any test results from the previous visit will be discussed.  You may have a cervical check near your due date to see if you have effaced. At around 36 weeks, your caregiver will check your cervix. At the same time, your caregiver will also perform a test on the secretions of the vaginal tissue. This test is to determine if a type of bacteria, Group B streptococcus, is present. Your caregiver will explain this further. Your caregiver may ask you:  What your birth plan is.  How you are feeling.  If you are feeling the baby move.  If you have had any abnormal symptoms, such as leaking fluid, bleeding, severe headaches, or abdominal cramping.  If you have any questions. Other tests or screenings that may be performed during your third trimester include:  Blood tests that check for low iron levels (anemia).  Fetal testing to check the health, activity level, and growth of the fetus. Testing is done if you have certain medical conditions or if there are problems during the pregnancy. FALSE LABOR You may feel small, irregular contractions that   eventually go away. These are called Braxton Hicks contractions, or false labor. Contractions may last for hours, days, or even weeks before true labor sets in. If contractions come at regular intervals, intensify, or become painful, it is best to be seen by your caregiver.  SIGNS OF LABOR   Menstrual-like cramps.  Contractions that are 5 minutes apart or less.  Contractions that start on the top of the uterus and spread down to the lower abdomen and back.  A sense of increased pelvic pressure or back  pain.  A watery or bloody mucus discharge that comes from the vagina. If you have any of these signs before the 37th week of pregnancy, call your caregiver right away. You need to go to the hospital to get checked immediately. HOME CARE INSTRUCTIONS   Avoid all smoking, herbs, alcohol, and unprescribed drugs. These chemicals affect the formation and growth of the baby.  Follow your caregiver's instructions regarding medicine use. There are medicines that are either safe or unsafe to take during pregnancy.  Exercise only as directed by your caregiver. Experiencing uterine cramps is a good sign to stop exercising.  Continue to eat regular, healthy meals.  Wear a good support bra for breast tenderness.  Do not use hot tubs, steam rooms, or saunas.  Wear your seat belt at all times when driving.  Avoid raw meat, uncooked cheese, cat litter boxes, and soil used by cats. These carry germs that can cause birth defects in the baby.  Take your prenatal vitamins.  Try taking a stool softener (if your caregiver approves) if you develop constipation. Eat more high-fiber foods, such as fresh vegetables or fruit and whole grains. Drink plenty of fluids to keep your urine clear or pale yellow.  Take warm sitz baths to soothe any pain or discomfort caused by hemorrhoids. Use hemorrhoid cream if your caregiver approves.  If you develop varicose veins, wear support hose. Elevate your feet for 15 minutes, 3-4 times a day. Limit salt in your diet.  Avoid heavy lifting, wear low heal shoes, and practice good posture.  Rest a lot with your legs elevated if you have leg cramps or low back pain.  Visit your dentist if you have not gone during your pregnancy. Use a soft toothbrush to brush your teeth and be gentle when you floss.  A sexual relationship may be continued unless your caregiver directs you otherwise.  Do not travel far distances unless it is absolutely necessary and only with the approval  of your caregiver.  Take prenatal classes to understand, practice, and ask questions about the labor and delivery.  Make a trial run to the hospital.  Pack your hospital bag.  Prepare the baby's nursery.  Continue to go to all your prenatal visits as directed by your caregiver. SEEK MEDICAL CARE IF:  You are unsure if you are in labor or if your water has broken.  You have dizziness.  You have mild pelvic cramps, pelvic pressure, or nagging pain in your abdominal area.  You have persistent nausea, vomiting, or diarrhea.  You have a bad smelling vaginal discharge.  You have pain with urination. SEEK IMMEDIATE MEDICAL CARE IF:   You have a fever.  You are leaking fluid from your vagina.  You have spotting or bleeding from your vagina.  You have severe abdominal cramping or pain.  You have rapid weight loss or gain.  You have shortness of breath with chest pain.  You notice sudden or extreme swelling   of your face, hands, ankles, feet, or legs.  You have not felt your baby move in over an hour.  You have severe headaches that do not go away with medicine.  You have vision changes. Document Released: 07/21/2001 Document Revised: 08/01/2013 Document Reviewed: 09/27/2012 [employer] Patient Information 2015 [employer]. This information is not intended to replace advice given to you by your health care provider. Make sure you discuss any questions you have with your health care provider.  

## 2014-06-13 LAB — PRENATAL PROFILE (SOLSTAS)
ANTIBODY SCREEN: NEGATIVE
BASOS ABS: 0 10*3/uL (ref 0.0–0.1)
Basophils Relative: 0 % (ref 0–1)
EOS ABS: 0.2 10*3/uL (ref 0.0–0.7)
EOS PCT: 3 % (ref 0–5)
HEMATOCRIT: 35.1 % — AB (ref 36.0–46.0)
HIV 1&2 Ab, 4th Generation: NONREACTIVE
Hemoglobin: 12.2 g/dL (ref 12.0–15.0)
Hepatitis B Surface Ag: NEGATIVE
LYMPHS PCT: 25 % (ref 12–46)
Lymphs Abs: 1.8 10*3/uL (ref 0.7–4.0)
MCH: 31 pg (ref 26.0–34.0)
MCHC: 34.8 g/dL (ref 30.0–36.0)
MCV: 89.1 fL (ref 78.0–100.0)
MONO ABS: 0.7 10*3/uL (ref 0.1–1.0)
Monocytes Relative: 10 % (ref 3–12)
Neutro Abs: 4.5 10*3/uL (ref 1.7–7.7)
Neutrophils Relative %: 62 % (ref 43–77)
PLATELETS: 262 10*3/uL (ref 150–400)
RBC: 3.94 MIL/uL (ref 3.87–5.11)
RDW: 13.9 % (ref 11.5–15.5)
RH TYPE: POSITIVE
Rubella: 2.31 Index — ABNORMAL HIGH (ref <0.90)
WBC: 7.2 10*3/uL (ref 4.0–10.5)

## 2014-06-14 ENCOUNTER — Encounter: Payer: Self-pay | Admitting: Obstetrics & Gynecology

## 2014-06-14 ENCOUNTER — Ambulatory Visit (HOSPITAL_COMMUNITY)
Admission: RE | Admit: 2014-06-14 | Discharge: 2014-06-14 | Disposition: A | Discharge status: Home / Self Care | Payer: Medicaid Other | Source: Ambulatory Visit | Attending: Obstetrics & Gynecology | Admitting: Obstetrics & Gynecology

## 2014-06-14 DIAGNOSIS — O365931 Maternal care for other known or suspected poor fetal growth, third trimester, fetus 1: Secondary | ICD-10-CM

## 2014-06-14 DIAGNOSIS — Z36 Encounter for antenatal screening of mother: Secondary | ICD-10-CM | POA: Diagnosis not present

## 2014-06-14 DIAGNOSIS — Z1389 Encounter for screening for other disorder: Secondary | ICD-10-CM | POA: Insufficient documentation

## 2014-06-14 DIAGNOSIS — O0933 Supervision of pregnancy with insufficient antenatal care, third trimester: Secondary | ICD-10-CM | POA: Diagnosis not present

## 2014-06-14 DIAGNOSIS — Z3A36 36 weeks gestation of pregnancy: Secondary | ICD-10-CM | POA: Diagnosis not present

## 2014-06-14 DIAGNOSIS — Z3490 Encounter for supervision of normal pregnancy, unspecified, unspecified trimester: Secondary | ICD-10-CM

## 2014-06-14 DIAGNOSIS — O0930 Supervision of pregnancy with insufficient antenatal care, unspecified trimester: Secondary | ICD-10-CM | POA: Insufficient documentation

## 2014-06-14 DIAGNOSIS — O365930 Maternal care for other known or suspected poor fetal growth, third trimester, not applicable or unspecified: Secondary | ICD-10-CM | POA: Diagnosis not present

## 2014-06-14 DIAGNOSIS — O365990 Maternal care for other known or suspected poor fetal growth, unspecified trimester, not applicable or unspecified: Secondary | ICD-10-CM | POA: Insufficient documentation

## 2014-06-14 DIAGNOSIS — O98213 Gonorrhea complicating pregnancy, third trimester: Secondary | ICD-10-CM

## 2014-06-14 HISTORY — DX: Gonorrhea complicating pregnancy, third trimester: O98.213

## 2014-06-14 LAB — GC/CHLAMYDIA PROBE AMP
CT Probe RNA: NEGATIVE
GC PROBE AMP APTIMA: POSITIVE — AB

## 2014-06-14 LAB — CULTURE, BETA STREP (GROUP B ONLY)

## 2014-06-14 IMAGING — US US OB COMP +14 WK
2 series · 12 of 28 positions shown · non-contrast
Comparison: none

[Series 1: us ob comp +14 wk mfm · 18 acquisitions, 3 frames shown (1 of 2)]
[im 4/18]
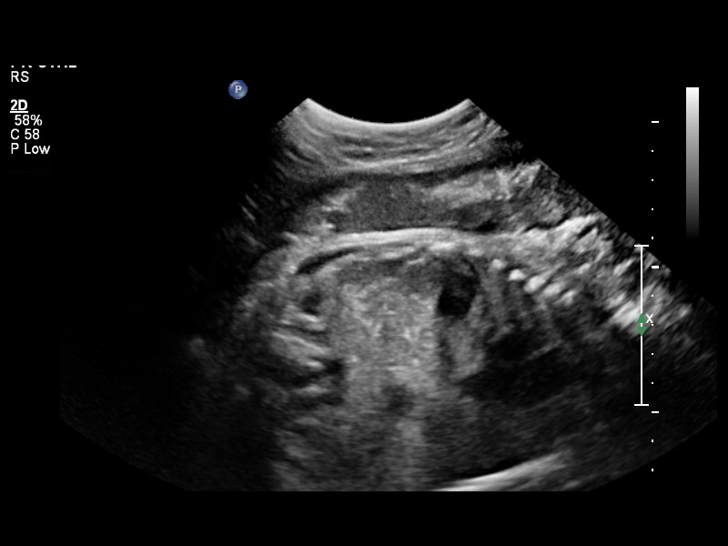
[im 11/18]
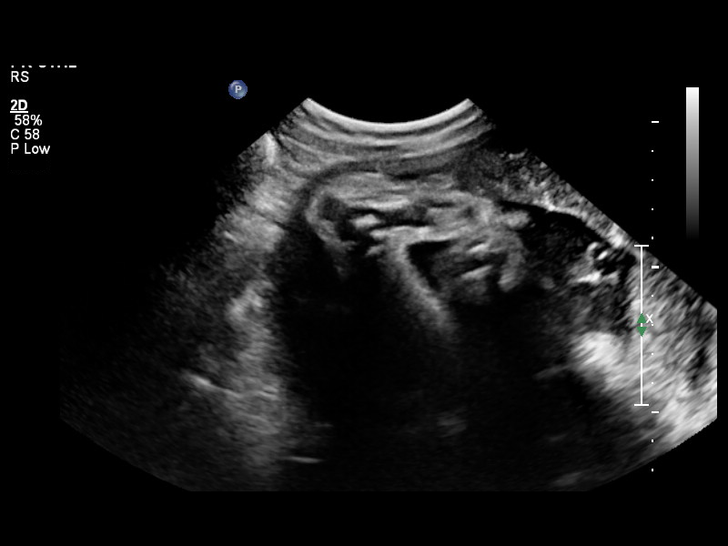
[im 18/18]
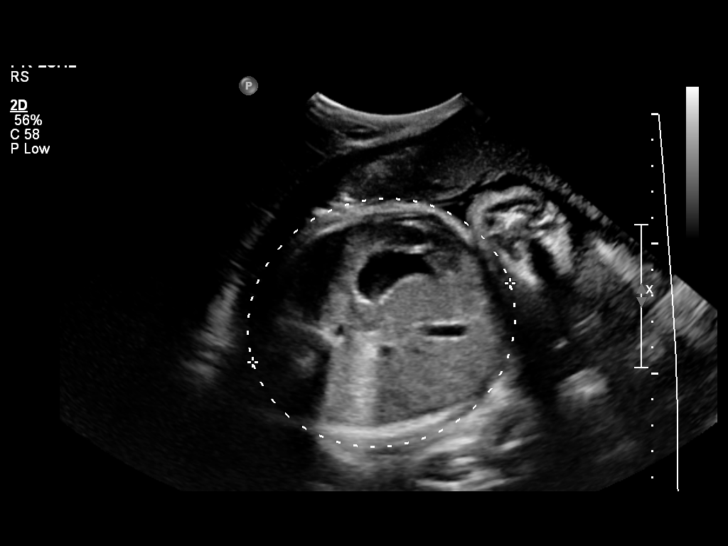

[Series 1: us ob comp +14 wk mfm · 64 acquisitions, 9 frames shown (2 of 2)]
[im 7/64]
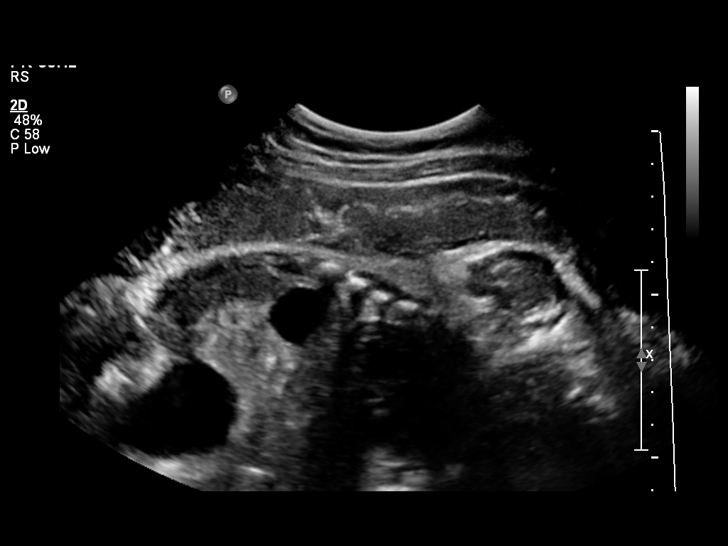
[im 13/64]
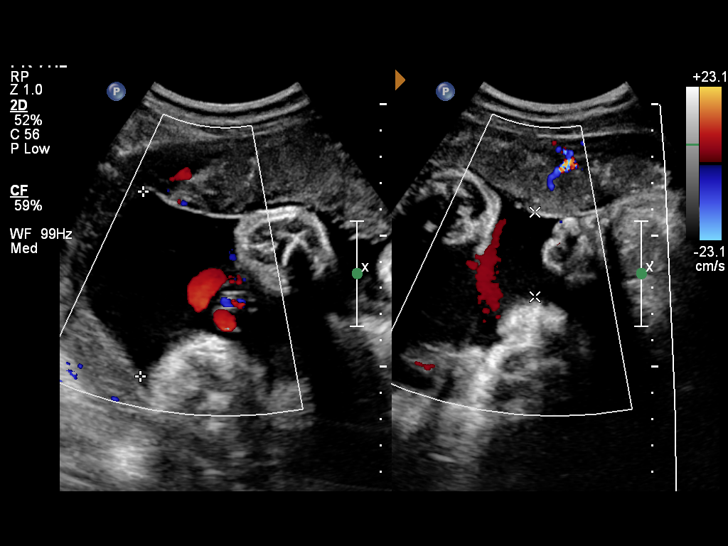
[im 19/64]
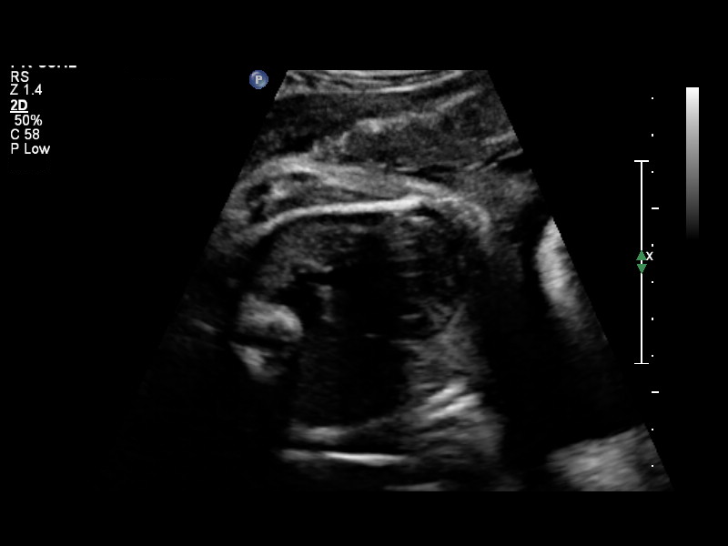
[im 28/64]
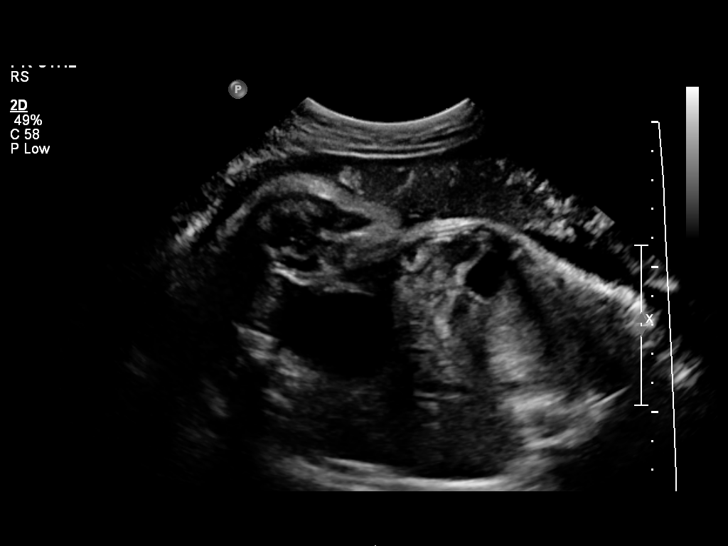
[im 34/64]
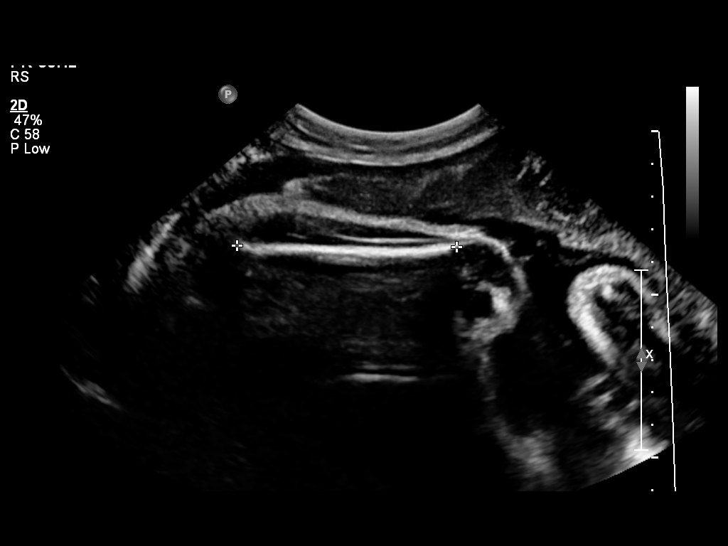
[im 40/64]
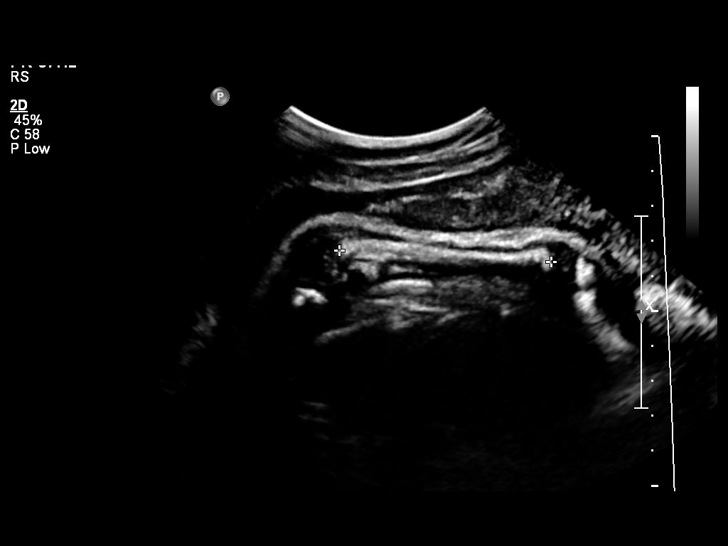
[im 49/64]
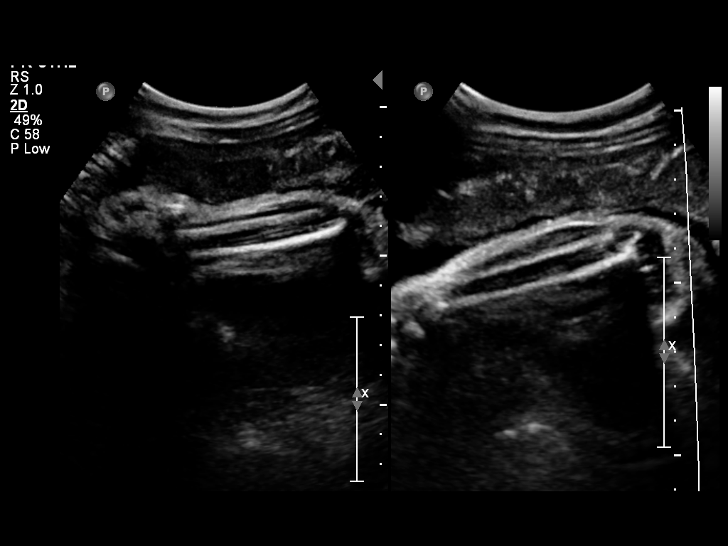
[im 55/64]
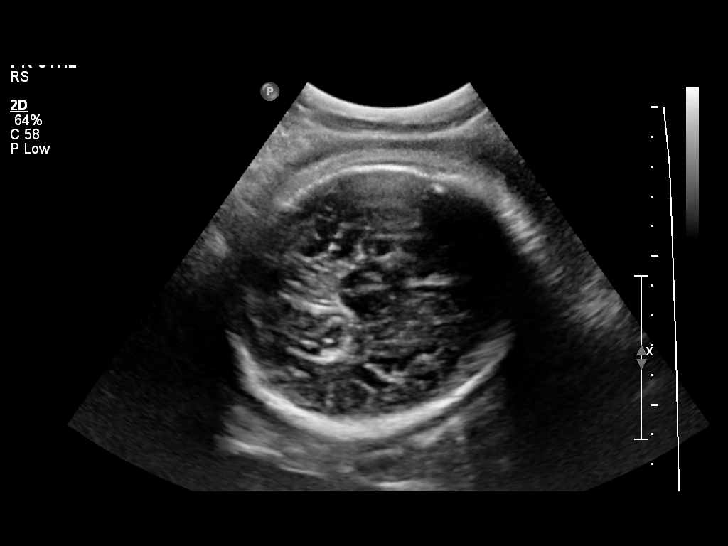
[im 61/64]
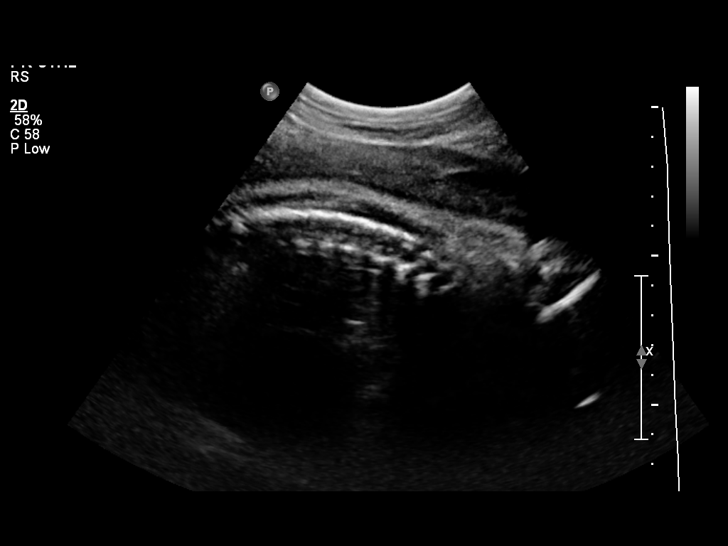

[12 of 28 positions shown; findings below may reference images not displayed]

OBSTETRICS REPORT
                      (Signed Final [DATE] [DATE])

Service(s) Provided

 US OB COMP + 14 WK                                    76805.1
Indications

 36 weeks gestation of pregnancy
 Basic anatomic survey                                 [id number]
 Size less than dates (Small for gestational age,      [1G]
 FGR)
 No or Little Prenatal Care                            [1G]
Fetal Evaluation

 Num Of Fetuses:    1
 Fetal Heart Rate:  147                          bpm
 Cardiac Activity:  Observed
 Presentation:      Cephalic
 Placenta:          Anterior, above cervical os
 P. Cord            Visualized
 Insertion:

 Amniotic Fluid
 AFI FV:      Subjectively within normal limits
 AFI Sum:     20.57   cm       78  %Tile     Larg Pckt:    7.34  cm
 RUQ:   7.34    cm   RLQ:    3      cm    LUQ:   7.02    cm   LLQ:    3.21   cm
Biometry

 BPD:     83.3  mm     G. Age:  33w 4d                CI:        73.26   70 - 86
                                                      FL/HC:      21.9   20.1 -

 HC:     309.3  mm     G. Age:  34w 4d      < 3  %    HC/AC:      0.98   0.93 -

 AC:     314.7  mm     G. Age:  35w 3d       39  %    FL/BPD:     81.3   71 - 87
 FL:      67.7  mm     G. Age:  34w 5d       16  %    FL/AC:      21.5   20 - 24
 HUM:       60  mm     G. Age:  34w 6d       39  %

 Est. FW:    [1G]  gm    5 lb 10 oz      34  %
Gestational Age

 LMP:           39w 0d        Date:  [DATE]                 EDD:   [DATE]
 U/S Today:     34w 4d                                        EDD:   [DATE]
 Best:          36w 1d     Det. By:  Previous Ultrasound      EDD:   [DATE]
Anatomy

 Cranium:          Appears normal         Aortic Arch:      Appears normal
 Fetal Cavum:      Appears normal         Ductal Arch:      Not well visualized
 Ventricles:       Appears normal         Diaphragm:        Appears normal
 Choroid Plexus:   Appears normal         Stomach:          Appears normal, left
                                                            sided
 Cerebellum:       Not well visualized    Abdomen:          Appears normal
 Posterior Fossa:  Not well visualized    Abdominal Wall:   Not well visualized
 Nuchal Fold:      Not applicable (>20    Cord Vessels:     Appears normal (3
                   wks GA)                                  vessel cord)
 Face:             Orbits nl; profile not Kidneys:          Appear normal
                   well visualized
 Lips:             Appears normal         Bladder:          Appears normal
 Heart:            Appears normal         Spine:            Ltd views no
                   (4CH, axis, and                          intracranial signs of
                   situs)                                   NTD
 RVOT:             Appears normal         Lower             Visualized
                                          Extremities:
 LVOT:             Appears normal         Upper             Visualized
                                          Extremities:
Cervix Uterus Adnexa

 Cervix:       Not visualized (advanced GA >[1G])

 Left Ovary:    Size(cm) L: 2.79 x W: 2.73 x H: 1.71  Volume(cc):
 Right Ovary:   Size(cm) L: 2.67 x W: 1.81 x H: 1.24  Volume(cc):
Impression

 SIUP at 36+1 weeks
 Normal but limited fetal anatomy; no gross abnormalities
 identifed
 Normal amniotic fluid volume
 Measurements consistent with prior US; EFW at the 34th
 %tile
Recommendations

 Follow-up as clinically indicated

## 2014-06-14 MED ORDER — AZITHROMYCIN 500 MG PO TABS: 1000.0000 mg | ORAL_TABLET | Freq: Once | ORAL | Status: DC

## 2014-06-14 NOTE — Addendum Note (Signed)
Addended by: Jaynie CollinsANYANWU, Kairon Shock A on: 06/14/2014 04:48 PM   Modules accepted: Orders

## 2014-06-14 NOTE — Progress Notes (Addendum)
Positive Gonorrhea on 06/12/14.  Azithromycin ordered, but needs to come in to clinic to get Ceftriaxone injection.  Patient will be called and told of results and need for treatment. Will do TOC at 39 weeks.

## 2014-06-15 DIAGNOSIS — B951 Streptococcus, group B, as the cause of diseases classified elsewhere: Secondary | ICD-10-CM

## 2014-06-15 DIAGNOSIS — O2343 Unspecified infection of urinary tract in pregnancy, third trimester: Secondary | ICD-10-CM

## 2014-06-15 HISTORY — DX: Streptococcus, group b, as the cause of diseases classified elsewhere: B95.1

## 2014-06-15 LAB — CULTURE, OB URINE

## 2014-06-15 MED ORDER — AMOXICILLIN 500 MG PO CAPS: 500.0000 mg | ORAL_CAPSULE | Freq: Three times a day (TID) | ORAL | Status: DC

## 2014-06-15 NOTE — Progress Notes (Signed)
Notified patient of test results, she will notify her partner to be tested and treated and she will come here Monday morning at 10 a.m. For the injection.

## 2014-06-15 NOTE — Addendum Note (Signed)
Addended by: Jaynie CollinsANYANWU, Cherity Blickenstaff A on: 06/15/2014 08:07 AM   Modules accepted: Orders

## 2014-06-18 ENCOUNTER — Ambulatory Visit: Payer: Medicaid Other

## 2014-06-19 ENCOUNTER — Encounter: Payer: Self-pay | Admitting: Obstetrics and Gynecology

## 2014-06-19 ENCOUNTER — Encounter: Payer: Medicaid Other | Admitting: Obstetrics and Gynecology

## 2014-06-19 ENCOUNTER — Ambulatory Visit (INDEPENDENT_AMBULATORY_CARE_PROVIDER_SITE_OTHER): Payer: Medicaid Other | Admitting: Obstetrics and Gynecology

## 2014-06-19 VITALS — BP 131/78 | HR 83 | Wt 168.0 lb

## 2014-06-19 DIAGNOSIS — O98213 Gonorrhea complicating pregnancy, third trimester: Secondary | ICD-10-CM

## 2014-06-19 DIAGNOSIS — O2343 Unspecified infection of urinary tract in pregnancy, third trimester: Secondary | ICD-10-CM

## 2014-06-19 DIAGNOSIS — B951 Streptococcus, group B, as the cause of diseases classified elsewhere: Secondary | ICD-10-CM

## 2014-06-19 DIAGNOSIS — O0933 Supervision of pregnancy with insufficient antenatal care, third trimester: Secondary | ICD-10-CM

## 2014-06-19 MED ORDER — CEFTRIAXONE SODIUM 1 G IJ SOLR
250.0000 mg | Freq: Once | INTRAMUSCULAR | Status: AC
  Administered 2014-06-19: 250 mg via INTRAMUSCULAR

## 2014-06-19 NOTE — Progress Notes (Signed)
Patient is doing well without complaints. FM/labor precautions reviewed 

## 2014-06-19 NOTE — Addendum Note (Signed)
Addended by: Barbara CowerNOGUES, Keontae Levingston L on: 06/19/2014 02:05 PM   Modules accepted: Orders

## 2014-06-26 ENCOUNTER — Ambulatory Visit (INDEPENDENT_AMBULATORY_CARE_PROVIDER_SITE_OTHER): Payer: Medicaid Other | Admitting: Family Medicine

## 2014-06-26 VITALS — BP 119/78 | HR 80 | Wt 171.0 lb

## 2014-06-26 DIAGNOSIS — O0933 Supervision of pregnancy with insufficient antenatal care, third trimester: Secondary | ICD-10-CM

## 2014-06-26 NOTE — Progress Notes (Signed)
Labor precautions TOC next week for + GC

## 2014-06-26 NOTE — Patient Instructions (Signed)
Third Trimester of Pregnancy The third trimester is from week 29 through week 42, months 7 through 9. The third trimester is a time when the fetus is growing rapidly. At the end of the ninth month, the fetus is about 20 inches in length and weighs 6-10 pounds.  BODY CHANGES Your body goes through many changes during pregnancy. The changes vary from woman to woman.   Your weight will continue to increase. You can expect to gain 25-35 pounds (11-16 kg) by the end of the pregnancy.  You may begin to get stretch marks on your hips, abdomen, and breasts.  You may urinate more often because the fetus is moving lower into your pelvis and pressing on your bladder.  You may develop or continue to have heartburn as a result of your pregnancy.  You may develop constipation because certain hormones are causing the muscles that push waste through your intestines to slow down.  You may develop hemorrhoids or swollen, bulging veins (varicose veins).  You may have pelvic pain because of the weight gain and pregnancy hormones relaxing your joints between the bones in your pelvis. Backaches may result from overexertion of the muscles supporting your posture.  You may have changes in your hair. These can include thickening of your hair, rapid growth, and changes in texture. Some women also have hair loss during or after pregnancy, or hair that feels dry or thin. Your hair will most likely return to normal after your baby is born.  Your breasts will continue to grow and be tender. A yellow discharge may leak from your breasts called colostrum.  Your belly button may stick out.  You may feel short of breath because of your expanding uterus.  You may notice the fetus "dropping," or moving lower in your abdomen.  You may have a bloody mucus discharge. This usually occurs a few days to a week before labor begins.  Your cervix becomes thin and soft (effaced) near your due date. WHAT TO EXPECT AT YOUR  PRENATAL EXAMS  You will have prenatal exams every 2 weeks until week 36. Then, you will have weekly prenatal exams. During a routine prenatal visit:  You will be weighed to make sure you and the fetus are growing normally.  Your blood pressure is taken.  Your abdomen will be measured to track your baby's growth.  The fetal heartbeat will be listened to.  Any test results from the previous visit will be discussed.  You may have a cervical check near your due date to see if you have effaced. At around 36 weeks, your caregiver will check your cervix. At the same time, your caregiver will also perform a test on the secretions of the vaginal tissue. This test is to determine if a type of bacteria, Group B streptococcus, is present. Your caregiver will explain this further. Your caregiver may ask you:  What your birth plan is.  How you are feeling.  If you are feeling the baby move.  If you have had any abnormal symptoms, such as leaking fluid, bleeding, severe headaches, or abdominal cramping.  If you have any questions. Other tests or screenings that may be performed during your third trimester include:  Blood tests that check for low iron levels (anemia).  Fetal testing to check the health, activity level, and growth of the fetus. Testing is done if you have certain medical conditions or if there are problems during the pregnancy. FALSE LABOR You may feel small, irregular contractions that   eventually go away. These are called Braxton Hicks contractions, or false labor. Contractions may last for hours, days, or even weeks before true labor sets in. If contractions come at regular intervals, intensify, or become painful, it is best to be seen by your caregiver.  SIGNS OF LABOR   Menstrual-like cramps.  Contractions that are 5 minutes apart or less.  Contractions that start on the top of the uterus and spread down to the lower abdomen and back.  A sense of increased pelvic  pressure or back pain.  A watery or bloody mucus discharge that comes from the vagina. If you have any of these signs before the 37th week of pregnancy, call your caregiver right away. You need to go to the hospital to get checked immediately. HOME CARE INSTRUCTIONS   Avoid all smoking, herbs, alcohol, and unprescribed drugs. These chemicals affect the formation and growth of the baby.  Follow your caregiver's instructions regarding medicine use. There are medicines that are either safe or unsafe to take during pregnancy.  Exercise only as directed by your caregiver. Experiencing uterine cramps is a good sign to stop exercising.  Continue to eat regular, healthy meals.  Wear a good support bra for breast tenderness.  Do not use hot tubs, steam rooms, or saunas.  Wear your seat belt at all times when driving.  Avoid raw meat, uncooked cheese, cat litter boxes, and soil used by cats. These carry germs that can cause birth defects in the baby.  Take your prenatal vitamins.  Try taking a stool softener (if your caregiver approves) if you develop constipation. Eat more high-fiber foods, such as fresh vegetables or fruit and whole grains. Drink plenty of fluids to keep your urine clear or pale yellow.  Take warm sitz baths to soothe any pain or discomfort caused by hemorrhoids. Use hemorrhoid cream if your caregiver approves.  If you develop varicose veins, wear support hose. Elevate your feet for 15 minutes, 3-4 times a day. Limit salt in your diet.  Avoid heavy lifting, wear low heal shoes, and practice good posture.  Rest a lot with your legs elevated if you have leg cramps or low back pain.  Visit your dentist if you have not gone during your pregnancy. Use a soft toothbrush to brush your teeth and be gentle when you floss.  A sexual relationship may be continued unless your caregiver directs you otherwise.  Do not travel far distances unless it is absolutely necessary and only  with the approval of your caregiver.  Take prenatal classes to understand, practice, and ask questions about the labor and delivery.  Make a trial run to the hospital.  Pack your hospital bag.  Prepare the baby's nursery.  Continue to go to all your prenatal visits as directed by your caregiver. SEEK MEDICAL CARE IF:  You are unsure if you are in labor or if your water has broken.  You have dizziness.  You have mild pelvic cramps, pelvic pressure, or nagging pain in your abdominal area.  You have persistent nausea, vomiting, or diarrhea.  You have a bad smelling vaginal discharge.  You have pain with urination. SEEK IMMEDIATE MEDICAL CARE IF:   You have a fever.  You are leaking fluid from your vagina.  You have spotting or bleeding from your vagina.  You have severe abdominal cramping or pain.  You have rapid weight loss or gain.  You have shortness of breath with chest pain.  You notice sudden or extreme swelling   of your face, hands, ankles, feet, or legs.  You have not felt your baby move in over an hour.  You have severe headaches that do not go away with medicine.  You have vision changes. Document Released: 07/21/2001 Document Revised: 08/01/2013 Document Reviewed: 09/27/2012 [employer] Patient Information 2015 [employer]. This information is not intended to replace advice given to you by your health care provider. Make sure you discuss any questions you have with your health care provider.  Breastfeeding Deciding to breastfeed is one of the best choices you can make for you and your baby. A change in hormones during pregnancy causes your breast tissue to grow and increases the number and size of your milk ducts. These hormones also allow proteins, sugars, and fats from your blood supply to make breast milk in your milk-producing glands. Hormones prevent breast milk from being released before your baby is born as well as prompt milk flow after birth. Once  breastfeeding has begun, thoughts of your baby, as well as his or her sucking or crying, can stimulate the release of milk from your milk-producing glands.  BENEFITS OF BREASTFEEDING For Your Baby  Your first milk (colostrum) helps your baby's digestive system function better.   There are antibodies in your milk that help your baby fight off infections.   Your baby has a lower incidence of asthma, allergies, and sudden infant death syndrome.   The nutrients in breast milk are better for your baby than infant formulas and are designed uniquely for your baby's needs.   Breast milk improves your baby's brain development.   Your baby is less likely to develop other conditions, such as childhood obesity, asthma, or type 2 diabetes mellitus.  For You   Breastfeeding helps to create a very special bond between you and your baby.   Breastfeeding is convenient. Breast milk is always available at the correct temperature and costs nothing.   Breastfeeding helps to burn calories and helps you lose the weight gained during pregnancy.   Breastfeeding makes your uterus contract to its prepregnancy size faster and slows bleeding (lochia) after you give birth.   Breastfeeding helps to lower your risk of developing type 2 diabetes mellitus, osteoporosis, and breast or ovarian cancer later in life. SIGNS THAT YOUR BABY IS HUNGRY Early Signs of Hunger  Increased alertness or activity.  Stretching.  Movement of the head from side to side.  Movement of the head and opening of the mouth when the corner of the mouth or cheek is stroked (rooting).  Increased sucking sounds, smacking lips, cooing, sighing, or squeaking.  Hand-to-mouth movements.  Increased sucking of fingers or hands. Late Signs of Hunger  Fussing.  Intermittent crying. Extreme Signs of Hunger Signs of extreme hunger will require calming and consoling before your baby will be able to breastfeed successfully. Do not  wait for the following signs of extreme hunger to occur before you initiate breastfeeding:   Restlessness.  A loud, strong cry.   Screaming. BREASTFEEDING BASICS Breastfeeding Initiation  Find a comfortable place to sit or lie down, with your neck and back well supported.  Place a pillow or rolled up blanket under your baby to bring him or her to the level of your breast (if you are seated). Nursing pillows are specially designed to help support your arms and your baby while you breastfeed.  Make sure that your baby's abdomen is facing your abdomen.   Gently massage your breast. With your fingertips, massage from your chest   wall toward your nipple in a circular motion. This encourages milk flow. You may need to continue this action during the feeding if your milk flows slowly.  Support your breast with 4 fingers underneath and your thumb above your nipple. Make sure your fingers are well away from your nipple and your baby's mouth.   Stroke your baby's lips gently with your finger or nipple.   When your baby's mouth is open wide enough, quickly bring your baby to your breast, placing your entire nipple and as much of the colored area around your nipple (areola) as possible into your baby's mouth.   More areola should be visible above your baby's upper lip than below the lower lip.   Your baby's tongue should be between his or her lower gum and your breast.   Ensure that your baby's mouth is correctly positioned around your nipple (latched). Your baby's lips should create a seal on your breast and be turned out (everted).  It is common for your baby to suck about 2-3 minutes in order to start the flow of breast milk. Latching Teaching your baby how to latch on to your breast properly is very important. An improper latch can cause nipple pain and decreased milk supply for you and poor weight gain in your baby. Also, if your baby is not latched onto your nipple properly, he or she  may swallow some air during feeding. This can make your baby fussy. Burping your baby when you switch breasts during the feeding can help to get rid of the air. However, teaching your baby to latch on properly is still the best way to prevent fussiness from swallowing air while breastfeeding. Signs that your baby has successfully latched on to your nipple:    Silent tugging or silent sucking, without causing you pain.   Swallowing heard between every 3-4 sucks.    Muscle movement above and in front of his or her ears while sucking.  Signs that your baby has not successfully latched on to nipple:   Sucking sounds or smacking sounds from your baby while breastfeeding.  Nipple pain. If you think your baby has not latched on correctly, slip your finger into the corner of your baby's mouth to break the suction and place it between your baby's gums. Attempt breastfeeding initiation again. Signs of Successful Breastfeeding Signs from your baby:   A gradual decrease in the number of sucks or complete cessation of sucking.   Falling asleep.   Relaxation of his or her body.   Retention of a small amount of milk in his or her mouth.   Letting go of your breast by himself or herself. Signs from you:  Breasts that have increased in firmness, weight, and size 1-3 hours after feeding.   Breasts that are softer immediately after breastfeeding.  Increased milk volume, as well as a change in milk consistency and color by the fifth day of breastfeeding.   Nipples that are not sore, cracked, or bleeding. Signs That Your Baby is Getting Enough Milk  Wetting at least 3 diapers in a 24-hour period. The urine should be clear and pale yellow by age 5 days.  At least 3 stools in a 24-hour period by age 5 days. The stool should be soft and yellow.  At least 3 stools in a 24-hour period by age 7 days. The stool should be seedy and yellow.  No loss of weight greater than 10% of birth weight  during the first 3   days of age.  Average weight gain of 4-7 ounces (113-198 g) per week after age 4 days.  Consistent daily weight gain by age 5 days, without weight loss after the age of 2 weeks. After a feeding, your baby may spit up a small amount. This is common. BREASTFEEDING FREQUENCY AND DURATION Frequent feeding will help you make more milk and can prevent sore nipples and breast engorgement. Breastfeed when you feel the need to reduce the fullness of your breasts or when your baby shows signs of hunger. This is called "breastfeeding on demand." Avoid introducing a pacifier to your baby while you are working to establish breastfeeding (the first 4-6 weeks after your baby is born). After this time you may choose to use a pacifier. Research has shown that pacifier use during the first year of a baby's life decreases the risk of sudden infant death syndrome (SIDS). Allow your baby to feed on each breast as long as he or she wants. Breastfeed until your baby is finished feeding. When your baby unlatches or falls asleep while feeding from the first breast, offer the second breast. Because newborns are often sleepy in the first few weeks of life, you may need to awaken your baby to get him or her to feed. Breastfeeding times will vary from baby to baby. However, the following rules can serve as a guide to help you ensure that your baby is properly fed:  Newborns (babies 4 weeks of age or younger) may breastfeed every 1-3 hours.  Newborns should not go longer than 3 hours during the day or 5 hours during the night without breastfeeding.  You should breastfeed your baby a minimum of 8 times in a 24-hour period until you begin to introduce solid foods to your baby at around 6 months of age. BREAST MILK PUMPING Pumping and storing breast milk allows you to ensure that your baby is exclusively fed your breast milk, even at times when you are unable to breastfeed. This is especially important if you are  going back to work while you are still breastfeeding or when you are not able to be present during feedings. Your lactation consultant can give you guidelines on how long it is safe to store breast milk.  A breast pump is a machine that allows you to pump milk from your breast into a sterile bottle. The pumped breast milk can then be stored in a refrigerator or freezer. Some breast pumps are operated by hand, while others use electricity. Ask your lactation consultant which type will work best for you. Breast pumps can be purchased, but some hospitals and breastfeeding support groups lease breast pumps on a monthly basis. A lactation consultant can teach you how to hand express breast milk, if you prefer not to use a pump.  CARING FOR YOUR BREASTS WHILE YOU BREASTFEED Nipples can become dry, cracked, and sore while breastfeeding. The following recommendations can help keep your breasts moisturized and healthy:  Avoid using soap on your nipples.   Wear a supportive bra. Although not required, special nursing bras and tank tops are designed to allow access to your breasts for breastfeeding without taking off your entire bra or top. Avoid wearing underwire-style bras or extremely tight bras.  Air dry your nipples for 3-4minutes after each feeding.   Use only cotton bra pads to absorb leaked breast milk. Leaking of breast milk between feedings is normal.   Use lanolin on your nipples after breastfeeding. Lanolin helps to maintain your skin's   normal moisture barrier. If you use pure lanolin, you do not need to wash it off before feeding your baby again. Pure lanolin is not toxic to your baby. You may also hand express a few drops of breast milk and gently massage that milk into your nipples and allow the milk to air dry. In the first few weeks after giving birth, some women experience extremely full breasts (engorgement). Engorgement can make your breasts feel heavy, warm, and tender to the touch.  Engorgement peaks within 3-5 days after you give birth. The following recommendations can help ease engorgement:  Completely empty your breasts while breastfeeding or pumping. You may want to start by applying warm, moist heat (in the shower or with warm water-soaked hand towels) just before feeding or pumping. This increases circulation and helps the milk flow. If your baby does not completely empty your breasts while breastfeeding, pump any extra milk after he or she is finished.  Wear a snug bra (nursing or regular) or tank top for 1-2 days to signal your body to slightly decrease milk production.  Apply ice packs to your breasts, unless this is too uncomfortable for you.  Make sure that your baby is latched on and positioned properly while breastfeeding. If engorgement persists after 48 hours of following these recommendations, contact your health care provider or a lactation consultant. OVERALL HEALTH CARE RECOMMENDATIONS WHILE BREASTFEEDING  Eat healthy foods. Alternate between meals and snacks, eating 3 of each per day. Because what you eat affects your breast milk, some of the foods may make your baby more irritable than usual. Avoid eating these foods if you are sure that they are negatively affecting your baby.  Drink milk, fruit juice, and water to satisfy your thirst (about 10 glasses a day).   Rest often, relax, and continue to take your prenatal vitamins to prevent fatigue, stress, and anemia.  Continue breast self-awareness checks.  Avoid chewing and smoking tobacco.  Avoid alcohol and drug use. Some medicines that may be harmful to your baby can pass through breast milk. It is important to ask your health care provider before taking any medicine, including all over-the-counter and prescription medicine as well as vitamin and herbal supplements. It is possible to become pregnant while breastfeeding. If birth control is desired, ask your health care provider about options that  will be safe for your baby. SEEK MEDICAL CARE IF:   You feel like you want to stop breastfeeding or have become frustrated with breastfeeding.  You have painful breasts or nipples.  Your nipples are cracked or bleeding.  Your breasts are red, tender, or warm.  You have a swollen area on either breast.  You have a fever or chills.  You have nausea or vomiting.  You have drainage other than breast milk from your nipples.  Your breasts do not become full before feedings by the fifth day after you give birth.  You feel sad and depressed.  Your baby is too sleepy to eat well.  Your baby is having trouble sleeping.   Your baby is wetting less than 3 diapers in a 24-hour period.  Your baby has less than 3 stools in a 24-hour period.  Your baby's skin or the white part of his or her eyes becomes yellow.   Your baby is not gaining weight by 5 days of age. SEEK IMMEDIATE MEDICAL CARE IF:   Your baby is overly tired (lethargic) and does not want to wake up and feed.  Your baby   develops an unexplained fever. Document Released: 07/27/2005 Document Revised: 08/01/2013 Document Reviewed: 01/18/2013 [employer] Patient Information 2015 [employer]. This information is not intended to replace advice given to you by your health care provider. Make sure you discuss any questions you have with your health care provider.  

## 2014-07-01 ENCOUNTER — Inpatient Hospital Stay (HOSPITAL_COMMUNITY): Payer: Medicaid Other | Admitting: Anesthesiology

## 2014-07-01 ENCOUNTER — Inpatient Hospital Stay (HOSPITAL_COMMUNITY)
Admission: AD | Admit: 2014-07-01 | Discharge: 2014-07-01 | Disposition: A | Discharge status: Home / Self Care | Payer: Medicaid Other | Source: Ambulatory Visit | Attending: Obstetrics & Gynecology | Admitting: Obstetrics & Gynecology

## 2014-07-01 ENCOUNTER — Inpatient Hospital Stay (HOSPITAL_COMMUNITY)
Admission: AD | Admit: 2014-07-01 | Discharge: 2014-07-01 | Disposition: A | Discharge status: Home / Self Care | Payer: Medicaid Other | Source: Ambulatory Visit | Attending: Obstetrics and Gynecology | Admitting: Obstetrics and Gynecology

## 2014-07-01 ENCOUNTER — Encounter (HOSPITAL_COMMUNITY): Payer: Self-pay

## 2014-07-01 ENCOUNTER — Encounter (HOSPITAL_COMMUNITY): Payer: Self-pay | Admitting: *Deleted

## 2014-07-01 ENCOUNTER — Inpatient Hospital Stay (HOSPITAL_COMMUNITY)
Admission: AD | Admit: 2014-07-01 | Discharge: 2014-07-03 | DRG: 775 | Disposition: A | Discharge status: Home / Self Care | Payer: Medicaid Other | Source: Ambulatory Visit | Attending: Obstetrics & Gynecology | Admitting: Obstetrics & Gynecology

## 2014-07-01 DIAGNOSIS — O99824 Streptococcus B carrier state complicating childbirth: Secondary | ICD-10-CM | POA: Diagnosis present

## 2014-07-01 DIAGNOSIS — O0933 Supervision of pregnancy with insufficient antenatal care, third trimester: Secondary | ICD-10-CM

## 2014-07-01 DIAGNOSIS — O479 False labor, unspecified: Secondary | ICD-10-CM

## 2014-07-01 DIAGNOSIS — IMO0001 Reserved for inherently not codable concepts without codable children: Secondary | ICD-10-CM

## 2014-07-01 DIAGNOSIS — O2343 Unspecified infection of urinary tract in pregnancy, third trimester: Secondary | ICD-10-CM

## 2014-07-01 DIAGNOSIS — B951 Streptococcus, group B, as the cause of diseases classified elsewhere: Secondary | ICD-10-CM

## 2014-07-01 DIAGNOSIS — O98213 Gonorrhea complicating pregnancy, third trimester: Secondary | ICD-10-CM

## 2014-07-01 DIAGNOSIS — Z3A38 38 weeks gestation of pregnancy: Secondary | ICD-10-CM | POA: Diagnosis present

## 2014-07-01 LAB — CBC
HCT: 34 % — ABNORMAL LOW (ref 36.0–46.0)
Hemoglobin: 12 g/dL (ref 12.0–15.0)
MCH: 31.4 pg (ref 26.0–34.0)
MCHC: 35.3 g/dL (ref 30.0–36.0)
MCV: 89 fL (ref 78.0–100.0)
PLATELETS: 261 10*3/uL (ref 150–400)
RBC: 3.82 MIL/uL — ABNORMAL LOW (ref 3.87–5.11)
RDW: 13.4 % (ref 11.5–15.5)
WBC: 10.8 10*3/uL — ABNORMAL HIGH (ref 4.0–10.5)

## 2014-07-01 LAB — TYPE AND SCREEN
ABO/RH(D): B POS
Antibody Screen: NEGATIVE

## 2014-07-01 LAB — ABO/RH: ABO/RH(D): B POS

## 2014-07-01 MED ORDER — EPHEDRINE 5 MG/ML INJ: 10.0000 mg | INTRAVENOUS | Status: DC | PRN

## 2014-07-01 MED ORDER — ACETAMINOPHEN 325 MG PO TABS
650.0000 mg | ORAL_TABLET | ORAL | Status: DC | PRN
Start: 2014-07-01 — End: 2014-07-02

## 2014-07-01 MED ORDER — EPHEDRINE 5 MG/ML INJ
10.0000 mg | INTRAVENOUS | Status: DC | PRN
  Filled 2014-07-01: qty 2

## 2014-07-01 MED ORDER — LACTATED RINGERS IV SOLN: 500.0000 mL | INTRAVENOUS | Status: DC | PRN

## 2014-07-01 MED ORDER — PHENYLEPHRINE 40 MCG/ML (10ML) SYRINGE FOR IV PUSH (FOR BLOOD PRESSURE SUPPORT)
80.0000 ug | PREFILLED_SYRINGE | INTRAVENOUS | Status: DC | PRN
Start: 2014-07-01 — End: 2014-07-02
  Filled 2014-07-01: qty 2

## 2014-07-01 MED ORDER — ONDANSETRON HCL 4 MG/2ML IJ SOLN: 4.0000 mg | Freq: Four times a day (QID) | INTRAMUSCULAR | Status: DC | PRN

## 2014-07-01 MED ORDER — OXYTOCIN BOLUS FROM INFUSION: 500.0000 mL | INTRAVENOUS | Status: DC

## 2014-07-01 MED ORDER — FENTANYL 2.5 MCG/ML BUPIVACAINE 1/10 % EPIDURAL INFUSION (WH - ANES): INTRAMUSCULAR | Status: DC | PRN

## 2014-07-01 MED ORDER — DIPHENHYDRAMINE HCL 50 MG/ML IJ SOLN: 12.5000 mg | INTRAMUSCULAR | Status: DC | PRN

## 2014-07-01 MED ORDER — LACTATED RINGERS IV SOLN
500.0000 mL | Freq: Once | INTRAVENOUS | Status: AC
  Administered 2014-07-01: 500 mL via INTRAVENOUS

## 2014-07-01 MED ORDER — OXYTOCIN 40 UNITS IN LACTATED RINGERS INFUSION - SIMPLE MED
62.5000 mL/h | INTRAVENOUS | Status: DC
  Administered 2014-07-01 – 2014-07-02 (×2): 62.5 mL/h via INTRAVENOUS
  Filled 2014-07-01: qty 1000

## 2014-07-01 MED ORDER — OXYCODONE-ACETAMINOPHEN 5-325 MG PO TABS: 2.0000 | ORAL_TABLET | ORAL | Status: DC | PRN

## 2014-07-01 MED ORDER — FENTANYL 2.5 MCG/ML BUPIVACAINE 1/10 % EPIDURAL INFUSION (WH - ANES): 14.0000 mL/h | INTRAMUSCULAR | Status: DC | PRN

## 2014-07-01 MED ORDER — OXYCODONE-ACETAMINOPHEN 5-325 MG PO TABS: 1.0000 | ORAL_TABLET | ORAL | Status: DC | PRN

## 2014-07-01 MED ORDER — CITRIC ACID-SODIUM CITRATE 334-500 MG/5ML PO SOLN: 30.0000 mL | ORAL | Status: DC | PRN

## 2014-07-01 MED ORDER — FENTANYL 2.5 MCG/ML BUPIVACAINE 1/10 % EPIDURAL INFUSION (WH - ANES)
INTRAMUSCULAR | Status: DC | PRN
  Administered 2014-07-01: 14 mL/h via EPIDURAL

## 2014-07-01 MED ORDER — PENICILLIN G POTASSIUM 5000000 UNITS IJ SOLR
2.5000 10*6.[IU] | INTRAMUSCULAR | Status: DC
Start: 2014-07-01 — End: 2014-07-02
  Administered 2014-07-01: 2.5 10*6.[IU] via INTRAVENOUS
  Filled 2014-07-01 (×5): qty 2.5

## 2014-07-01 MED ORDER — FENTANYL 2.5 MCG/ML BUPIVACAINE 1/10 % EPIDURAL INFUSION (WH - ANES)
INTRAMUSCULAR | Status: DC
Start: 2014-07-01 — End: 2014-07-02
  Filled 2014-07-01: qty 125

## 2014-07-01 MED ORDER — LACTATED RINGERS IV SOLN: 500.0000 mL | Freq: Once | INTRAVENOUS | Status: DC

## 2014-07-01 MED ORDER — PHENYLEPHRINE 40 MCG/ML (10ML) SYRINGE FOR IV PUSH (FOR BLOOD PRESSURE SUPPORT)
PREFILLED_SYRINGE | INTRAVENOUS | Status: DC
Start: 2014-07-01 — End: 2014-07-02
  Filled 2014-07-01: qty 10

## 2014-07-01 MED ORDER — LACTATED RINGERS IV SOLN
INTRAVENOUS | Status: DC
  Administered 2014-07-01: 23:00:00 via INTRAVENOUS

## 2014-07-01 MED ORDER — PHENYLEPHRINE 40 MCG/ML (10ML) SYRINGE FOR IV PUSH (FOR BLOOD PRESSURE SUPPORT)
80.0000 ug | PREFILLED_SYRINGE | INTRAVENOUS | Status: DC | PRN
  Filled 2014-07-01: qty 2

## 2014-07-01 MED ORDER — PHENYLEPHRINE 40 MCG/ML (10ML) SYRINGE FOR IV PUSH (FOR BLOOD PRESSURE SUPPORT): 80.0000 ug | PREFILLED_SYRINGE | INTRAVENOUS | Status: DC | PRN

## 2014-07-01 MED ORDER — FLEET ENEMA 7-19 GM/118ML RE ENEM: 1.0000 | ENEMA | RECTAL | Status: DC | PRN

## 2014-07-01 MED ORDER — FENTANYL CITRATE 0.05 MG/ML IJ SOLN: 100.0000 ug | INTRAMUSCULAR | Status: DC | PRN

## 2014-07-01 MED ORDER — LIDOCAINE HCL (PF) 1 % IJ SOLN
30.0000 mL | INTRAMUSCULAR | Status: DC | PRN
  Filled 2014-07-01: qty 30

## 2014-07-01 MED ORDER — DEXTROSE 5 % IV SOLN
5.0000 10*6.[IU] | Freq: Once | INTRAVENOUS | Status: AC
  Administered 2014-07-01: 5 10*6.[IU] via INTRAVENOUS
  Filled 2014-07-01: qty 5

## 2014-07-01 MED ORDER — OXYCODONE-ACETAMINOPHEN 5-325 MG PO TABS
2.0000 | ORAL_TABLET | Freq: Once | ORAL | Status: AC
  Administered 2014-07-01: 2 via ORAL
  Filled 2014-07-01: qty 2

## 2014-07-01 MED ORDER — LIDOCAINE HCL (PF) 1 % IJ SOLN
INTRAMUSCULAR | Status: DC | PRN
  Administered 2014-07-01 (×2): 4 mL

## 2014-07-01 NOTE — MAU Provider Note (Signed)
Patient admitted from MAU for labor.  See H&P for details.  Shirlee LatchAngela Bacigalupo, MD, MPH PGY-1,  [facility] 07/01/2014 6:32 PM   Attestation of Attending Supervision of Advanced Practitioner (CNM/NP): Evaluation and management procedures were performed by the Advanced Practitioner under my supervision and collaboration.  I have reviewed the Advanced Practitioner's note and chart, and I agree with the management and plan.  HARRAWAY-SMITH, Asti Mackley 8:30 PM

## 2014-07-01 NOTE — Progress Notes (Signed)
Patient ID: Michele Torres, female   DOB: 08-13-1990, 23 y.o.   MRN: [id number] Pt complete and +2.  +deep variables to 70's for less than a minute with pushing with return to baseline.  Good fetal descent with pushing, 02 on, delivery imminent.

## 2014-07-01 NOTE — MAU Note (Signed)
Pt states was here last pm for labor eval, now ctx's are stronger and was told to come back if contractions didn't stop. Bloody show noted, no gush of fluid.

## 2014-07-01 NOTE — Anesthesia Preprocedure Evaluation (Signed)
Anesthesia Evaluation  Patient identified by MRN, date of birth, ID band Patient awake    Reviewed: Allergy & Precautions, H&P , NPO status , Patient's Chart, lab work & pertinent test results  Airway Mallampati: II TM Distance: >3 FB Neck ROM: Full    Dental no notable dental hx.    Pulmonary neg pulmonary ROS,  breath sounds clear to auscultation  Pulmonary exam normal       Cardiovascular negative cardio ROS  Rhythm:Regular Rate:Normal     Neuro/Psych negative neurological ROS  negative psych ROS   GI/Hepatic negative GI ROS, Neg liver ROS,   Endo/Other  negative endocrine ROS  Renal/GU negative Renal ROS     Musculoskeletal negative musculoskeletal ROS (+)   Abdominal   Peds  Hematology negative hematology ROS (+)   Anesthesia Other Findings   Reproductive/Obstetrics (+) Pregnancy                           Anesthesia Physical Anesthesia Plan  ASA: II  Anesthesia Plan: Epidural   Post-op Pain Management:    Induction:   Airway Management Planned:   Additional Equipment:   Intra-op Plan:   Post-operative Plan:   Informed Consent: I have reviewed the patients History and Physical, chart, labs and discussed the procedure including the risks, benefits and alternatives for the proposed anesthesia with the patient or authorized representative who has indicated his/her understanding and acceptance.     Plan Discussed with:   Anesthesia Plan Comments:         Anesthesia Quick Evaluation  

## 2014-07-01 NOTE — MAU Note (Signed)
Pt presents with worsening contractions. States they are every 5 minutes. Was in MAU this am and 2.5cm. Denies leaking of fluid or vaginal bleeding.

## 2014-07-01 NOTE — Discharge Instructions (Signed)

## 2014-07-01 NOTE — Discharge Instructions (Signed)

## 2014-07-01 NOTE — H&P (Signed)
Michele Torres is a 23 y.o. female G1P0 at 1541w4d by LMP presenting for labor.  Patient reports regular painful contractions ~q295min.  She was seen 2 other times in MAU within the last 24 hours for labor evals.  Cervix previously 2.5 cm, now 5.5cm.  +FM, no VB, LOF.   Clinic [place] Prenatal Labs  Dating LMP Blood type: B/POS/-- (11/03 1140)  Genetic Screen Not done Antibody:NEG (11/03 1140)  Anatomic US Normal at 36 weeks; EFW 34% Rubella: 2.31 (11/03 1140)  GTT  Third trimester: normal as per patient; awaiting records RPR: NON REAC (11/03 1140)   TDaP vaccine 06/12/14 HBsAg: NEGATIVE (11/03 1140)   Flu vaccine Declined HIV: NONREACTIVE (11/03 1140)   GBS Positive GBS: Positive in urine and RV culture 06/12/14  Contraception Depo Provera +Chlamydia on 06/12/14  Baby Food Bottle   Circumcision N/a - girl   Pediatrician    Support Person Boyfriend     Maternal Medical History:  Reason for admission: Nausea.    OB History    Gravida Para Term Preterm AB TAB SAB Ectopic Multiple Living   1 0 0 0 0 0 0 0 0 0      Past Medical History  Diagnosis Date  . Medical history non-contributory    Past Surgical History  Procedure Laterality Date  . No past surgeries     Family History: family history includes Mental illness in her maternal grandmother. Social History:  reports that she has never smoked. She has never used smokeless tobacco. She reports that she does not drink alcohol or use illicit drugs.   Prenatal Transfer Tool  Maternal Diabetes: No Genetic Screening: Declined Maternal Ultrasounds/Referrals: Normal Fetal Ultrasounds or other Referrals:  None Maternal Substance Abuse:  No Significant Maternal Medications:  None Significant Maternal Lab Results:  Lab values include: Group B Strep positive, Other: + Chlamydia on 06/12/14 Other Comments:  +Chlamydia on 06/12/14 - received treatment, no TOC  Review of Systems  Constitutional: Negative for fever and chills.  Respiratory:  Negative for shortness of breath.   Cardiovascular: Negative for chest pain.  Gastrointestinal: Negative for nausea, vomiting and diarrhea.  Neurological: Negative for headaches.    Dilation: 5.5 Effacement (%): 100 Station: -2, -1 Exam by:: Sharen Hintaroline Brewer RN   Blood pressure 133/89, pulse 90, temperature 98 F (36.7 C), temperature source Oral, resp. rate 18, last menstrual period 09/14/2013. Maternal Exam:  Uterine Assessment: Contraction strength is moderate.  Contraction frequency is regular.   Introitus: Ferning test: not done.   Pelvis: adequate for delivery.      Fetal Exam Fetal Monitor Review: Baseline rate: 140-150.  Variability: moderate (6-25 bpm).   Pattern: accelerations present and no decelerations.    Fetal State Assessment: Category I - tracings are normal.     Physical Exam  Constitutional: She is oriented to person, place, and time. She appears well-developed and well-nourished. No distress.  HENT:  Head: Normocephalic and atraumatic.  Cardiovascular: Normal rate and intact distal pulses.   Respiratory: Effort normal. No respiratory distress.  Musculoskeletal: She exhibits no edema or tenderness.  Neurological: She is alert and oriented to person, place, and time.  Skin: Skin is warm and dry.    Prenatal labs: ABO, Rh: B/POS/-- (11/03 1140) Antibody: NEG (11/03 1140) Rubella: 2.31 (11/03 1140) RPR: NON REAC (11/03 1140)  HBsAg: NEGATIVE (11/03 1140)  HIV: NONREACTIVE (11/03 1140)  GBS:     Assessment/Plan: Michele Torres is a 23 y.o. G1P0000 at 5041w4d by LMP here for  labor.  #Labor: expectant management #Pain: Patient desires epidural, IV Fentanyl 100mg  q1h prn until epidural #FWB: Cat 1, FHR baseline 140-150, mod variability, accels present, decels absent #ID:  GBS positive - Pen G #MOF: bottle #MOC:Depo Provera #Circ:  N/a, female   Shirlee LatchBacigalupo, Angela 07/01/2014, 6:17 PM

## 2014-07-01 NOTE — Progress Notes (Signed)
Michele Torres is a 23 y.o. G1P0000 at 1954w4d admitted for active labor  Subjective: Patient not feeling contractions.   Objective: BP 144/82 mmHg  Pulse 95  Temp(Src) 98.4 F (36.9 C) (Oral)  Resp 20  Ht 5\' 7"  (1.702 m)  Wt 77.111 kg (170 lb)  BMI 26.62 kg/m2  SpO2 100%  LMP 09/14/2013      FHT:  FHR: 145 bpm, variability: moderate,  accelerations:  Present,  decelerations:  Present early UC:   regular, every 3-4 minutes SVE:   Dilation: 10 Effacement (%): 100 Station: +1 Exam by:: Michele Hawkingalph Jacorie Ernsberger, MD  Labs: Lab Results  Component Value Date   WBC 10.8* 07/01/2014   HGB 12.0 07/01/2014   HCT 34.0* 07/01/2014   MCV 89.0 07/01/2014   PLT 261 07/01/2014    Assessment / Plan: Spontaneous labor, progressing normally  Labor: Progressing normally, AROM performed Fetal Wellbeing:  Category I Pain Control:  Epidural I/D:  GBS positive. Penicillin start at 1850 Anticipated MOD:  NSVD  Michele Torres, Michele Torres 07/01/2014, 11:18 PM

## 2014-07-01 NOTE — MAU Note (Signed)
Pt presents to MAU with complaints of contractions. States that she was evaluated in MAU early this morning and sent home at 4am. Reports nurse told her to come back in if her contractions didn't stop. Denies any vaginal bleeding or LOF

## 2014-07-01 NOTE — Progress Notes (Signed)
Dr Beryle FlockBacigalupo notified of no change in VE, orders received to discharge home.

## 2014-07-01 NOTE — Progress Notes (Signed)
Dr. Karolee OhsBacingalupri notified of pt in MAU, was seen last pm, cervix unchanged, will recheck in one hour then cb with results.

## 2014-07-01 NOTE — MAU Note (Signed)
Having contractions for couple hours that feel like menstrual cramps. Denies bleeding or LOF.

## 2014-07-01 NOTE — Anesthesia Procedure Notes (Signed)
Epidural Patient location during procedure: OB  Staffing Anesthesiologist: Rocko Fesperman R Performed by: anesthesiologist   Preanesthetic Checklist Completed: patient identified, pre-op evaluation, timeout performed, IV checked, risks and benefits discussed and monitors and equipment checked  Epidural Patient position: sitting Prep: site prepped and draped and DuraPrep Patient monitoring: heart rate Approach: midline Location: L3-L4 Injection technique: LOR air and LOR saline  Needle:  Needle type: Tuohy  Needle gauge: 17 G Needle length: 9 cm Needle insertion depth: 6 cm Catheter type: closed end flexible Catheter size: 19 Gauge Catheter at skin depth: 12 cm Test dose: negative  Assessment Sensory level: T8 Events: blood not aspirated, injection not painful, no injection resistance, negative IV test and no paresthesia  Additional Notes Reason for block:procedure for pain   

## 2014-07-02 ENCOUNTER — Encounter (HOSPITAL_COMMUNITY): Payer: Self-pay | Admitting: *Deleted

## 2014-07-02 DIAGNOSIS — Z3A38 38 weeks gestation of pregnancy: Secondary | ICD-10-CM

## 2014-07-02 DIAGNOSIS — B951 Streptococcus, group B, as the cause of diseases classified elsewhere: Secondary | ICD-10-CM

## 2014-07-02 DIAGNOSIS — O9882 Other maternal infectious and parasitic diseases complicating childbirth: Secondary | ICD-10-CM

## 2014-07-02 LAB — RPR

## 2014-07-02 MED ORDER — IBUPROFEN 600 MG PO TABS
600.0000 mg | ORAL_TABLET | Freq: Four times a day (QID) | ORAL | Status: DC
  Administered 2014-07-02 – 2014-07-03 (×7): 600 mg via ORAL
  Filled 2014-07-02 (×7): qty 1

## 2014-07-02 MED ORDER — ZOLPIDEM TARTRATE 5 MG PO TABS: 5.0000 mg | ORAL_TABLET | Freq: Every evening | ORAL | Status: DC | PRN

## 2014-07-02 MED ORDER — PRENATAL MULTIVITAMIN CH
1.0000 | ORAL_TABLET | Freq: Every day | ORAL | Status: DC
  Administered 2014-07-02 – 2014-07-03 (×2): 1 via ORAL
  Filled 2014-07-02 (×2): qty 1

## 2014-07-02 MED ORDER — BENZOCAINE-MENTHOL 20-0.5 % EX AERO
1.0000 | INHALATION_SPRAY | CUTANEOUS | Status: DC | PRN
Start: 2014-07-02 — End: 2014-07-03
  Filled 2014-07-02: qty 56

## 2014-07-02 MED ORDER — SENNOSIDES-DOCUSATE SODIUM 8.6-50 MG PO TABS
2.0000 | ORAL_TABLET | ORAL | Status: DC
  Administered 2014-07-02: 2 via ORAL
  Filled 2014-07-02: qty 2

## 2014-07-02 MED ORDER — OXYCODONE-ACETAMINOPHEN 5-325 MG PO TABS
1.0000 | ORAL_TABLET | ORAL | Status: DC | PRN
  Administered 2014-07-02: 1 via ORAL
  Filled 2014-07-02: qty 1

## 2014-07-02 MED ORDER — SIMETHICONE 80 MG PO CHEW: 80.0000 mg | CHEWABLE_TABLET | ORAL | Status: DC | PRN

## 2014-07-02 MED ORDER — OXYCODONE-ACETAMINOPHEN 5-325 MG PO TABS: 2.0000 | ORAL_TABLET | ORAL | Status: DC | PRN

## 2014-07-02 MED ORDER — TETANUS-DIPHTH-ACELL PERTUSSIS 5-2.5-18.5 LF-MCG/0.5 IM SUSP: 0.5000 mL | Freq: Once | INTRAMUSCULAR | Status: DC

## 2014-07-02 MED ORDER — DIPHENHYDRAMINE HCL 25 MG PO CAPS: 25.0000 mg | ORAL_CAPSULE | Freq: Four times a day (QID) | ORAL | Status: DC | PRN

## 2014-07-02 MED ORDER — ONDANSETRON HCL 4 MG/2ML IJ SOLN: 4.0000 mg | INTRAMUSCULAR | Status: DC | PRN

## 2014-07-02 MED ORDER — ONDANSETRON HCL 4 MG PO TABS: 4.0000 mg | ORAL_TABLET | ORAL | Status: DC | PRN

## 2014-07-02 MED ORDER — DIBUCAINE 1 % RE OINT: 1.0000 "application " | TOPICAL_OINTMENT | RECTAL | Status: DC | PRN

## 2014-07-02 MED ORDER — WITCH HAZEL-GLYCERIN EX PADS: 1.0000 "application " | MEDICATED_PAD | CUTANEOUS | Status: DC | PRN

## 2014-07-02 MED ORDER — LANOLIN HYDROUS EX OINT: TOPICAL_OINTMENT | CUTANEOUS | Status: DC | PRN

## 2014-07-02 NOTE — Progress Notes (Signed)
UR chart review completed.  

## 2014-07-02 NOTE — Plan of Care (Signed)
Problem: Phase II Progression Outcomes Goal: Pain controlled on oral analgesia Outcome: Completed/Met Date Met:  07/02/14 Goal: Progress activity as tolerated unless otherwise ordered Outcome: Completed/Met Date Met:  07/02/14 Goal: Incision intact & without signs/symptoms of infection Outcome: Not Applicable Date Met:  29/02/11 Goal: Rh isoimmunization per orders Outcome: Not Applicable Date Met:  15/52/08 Goal: Tolerating diet Outcome: Completed/Met Date Met:  07/02/14

## 2014-07-02 NOTE — Plan of Care (Signed)
Problem: Consults Goal: Postpartum Patient Education (See Patient Education module for education specifics.)  Outcome: Completed/Met Date Met:  07/02/14 Goal: Skin Care Protocol Initiated - if Braden Score 18 or less If consults are not indicated, leave blank or document N/A  Outcome: Not Applicable Date Met:  82/88/33 Goal: Nutrition Consult-if indicated Outcome: Not Applicable Date Met:  74/45/14 Goal: Diabetes Guidelines if Diabetic/Glucose > 140 If diabetic or lab glucose is > 140 mg/dl - Initiate Diabetes/Hyperglycemia Guidelines & Document Interventions  Outcome: Not Applicable Date Met:  60/47/99  Problem: Phase I Progression Outcomes Goal: Pain controlled with appropriate interventions Outcome: Completed/Met Date Met:  07/02/14 Goal: Voiding adequately Outcome: Completed/Met Date Met:  07/02/14

## 2014-07-02 NOTE — Plan of Care (Signed)
Problem: Phase I Progression Outcomes Goal: Foley catheter patent Outcome: Not Applicable Date Met:  69/45/03 Goal: OOB as tolerated unless otherwise ordered Outcome: Completed/Met Date Met:  07/02/14 Goal: IS, TCDB as ordered Outcome: Not Applicable Date Met:  88/82/80 Goal: VS, stable, temp < 100.4 degrees F Outcome: Completed/Met Date Met:  07/02/14 Goal: Initial discharge plan identified Outcome: Completed/Met Date Met:  07/02/14 Goal: Other Phase I Outcomes/Goals Outcome: Completed/Met Date Met:  07/02/14  Problem: Phase II Progression Outcomes Goal: Pain controlled on oral analgesia Outcome: Adequate for Discharge

## 2014-07-02 NOTE — Progress Notes (Signed)
  Subjective: no complaints, up ad lib and voiding.  Desires depo for family planning.    Objective: Blood pressure 134/74, pulse 108, temperature 99.7 F (37.6 C), temperature source Axillary, resp. rate 20, height 5\' 7"  (1.702 m), weight 77.111 kg (170 lb), last menstrual period 09/14/2013, SpO2 100 %, unknown if currently breastfeeding.  Physical Exam:  Filed Vitals:   07/02/14 0310  BP: 134/74  Pulse: 108  Temp: 99.7 F (37.6 C)  Resp: 20   General: alert, cooperative and appears stated age 71Lochia: appropriate Uterine Fundus: firm Incision: n/a DVT Evaluation: No evidence of DVT seen on physical exam. Negative Homan's sign.   Recent Labs  07/01/14 1825  HGB 12.0  HCT 34.0*    Assessment/Plan: Plan for discharge tomorrow   LOS: 1 day   Marlis EdelsonKARIM, WALIDAH N 07/02/2014, 7:21 AM

## 2014-07-02 NOTE — Anesthesia Postprocedure Evaluation (Signed)
Anesthesia Post Note  Patient: Michele Torres  Procedure(s) Performed: * No procedures listed *  Anesthesia type: Epidural  Patient location: Mother/Baby  Post pain: Pain level controlled  Post assessment: Post-op Vital signs reviewed  Last Vitals:  Filed Vitals:   07/02/14 0655  BP: 95/57  Pulse: 81  Temp: 36.2 C  Resp: 20    Post vital signs: Reviewed  Level of consciousness:alert  Complications: No apparent anesthesia complications

## 2014-07-03 ENCOUNTER — Encounter: Payer: Medicaid Other | Admitting: Family Medicine

## 2014-07-03 LAB — GC/CHLAMYDIA PROBE AMP
CT PROBE, AMP APTIMA: NEGATIVE
GC Probe RNA: NEGATIVE

## 2014-07-03 MED ORDER — IBUPROFEN 600 MG PO TABS: 600.0000 mg | ORAL_TABLET | Freq: Four times a day (QID) | ORAL | Status: DC | PRN

## 2014-07-03 NOTE — Progress Notes (Signed)
Clinical Social Work Department PSYCHOSOCIAL ASSESSMENT - MATERNAL/CHILD 07/03/2014  Patient:  TRIVA, HUEBER  Account Number:  [id number]  [place] Date:  07/01/2014  Ardine Eng Name:   [place] Worker:  Lucita Ferrara, CLINICAL SOCIAL WORKER   Date/Time:  07/03/2014 08:30 AM  Date Referred:  07/02/2014   Referral source  Central Nursery     Referred reason  [facility]   Other referral source:    I:  FAMILY / HOME ENVIRONMENT Child's legal guardian:  PARENT  Guardian - Name Guardian - Age [place] [address], [place] [address]   Other household support members/support persons Other support:   MOB identified the FOB and his family is supportive.  She stated that her family lives in [place].    II  PSYCHOSOCIAL DATA Information Source:  Patient Interview  Occupational hygienist Employment:   MOB stated that she is unemployed. 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   Financial resources:  Medicaid If Medicaid - County:  [place] Other  [place] / Grade:  N/A Music therapist / Child Services Coordination / Early Interventions:   None reported.  Cultural issues impacting care:   None reported.    III  STRENGTHS Strengths  Adequate Resources  Home prepared for Child (including basic supplies)  Supportive family/friends   Strength comment:    IV  RISK FACTORS AND CURRENT PROBLEMS Current Problem:  YES   Risk Factor & Current Problem Patient Issue Family Issue Risk Factor / Current Problem Comment  Other - See comment Y N Unable to verify prenatal care prior to transfer to [facility] at 35 weeks. Baby's UDS is negative.    V  SOCIAL WORK ASSESSMENT CSW met with the MOB in her room in order to complete the assessment. Consult was ordered due to inability to confirm prenatal care prior to transfer to [facility] at 35 weeks.  MOB presented as easily engaged and was receptive to  the visit. She openly discussed her thoughts and feelings and answered questions appropriately.  MOB displayed a full range in affect, presented in a pleasant mood, and was observed to be attentive and bonding with the baby.   MOB expressed feelings of excitement as she transitions to the postpartum period and adjusts to her new role as a mother.  She shared that she is looking forward to returning home and establishing a routine.  Per MOB, she lives with her boyfriend, and discussed belief that she is well supported.  MOB discussed low stress in life, and shared belief that she does not attempt to allow stress to overcome her.  MOB denied any acute stressors during her pregnancy.  She shared that she was living in [facility] [place] [facility] during first trimester of her pregnancy, but moved to [place] since she attends [employer] and is pursuing a degree in sociology.  She shared that she has been taking online classes, and voiced intention to take one semester off before returning to school.  MOB denied any stress associated with attempting to balance multiple roles in life.    MOB denied mental health history, but acknowledged education on postpartum depression.   MOB reported receiving prenatal care at [place] prior to returning to [facility]. CSW provided education on hospital drug screen policy due to inability to confirm prenatal care from [facility].  MOB voiced no concerns about the drug screen policy and  expressed understanding.  MOB denied any substance use history.    No barriers to discharge.   VI SOCIAL WORK PLAN Social Work Therapist, art  No Further Intervention Required / No Barriers to Discharge   Type of pt/family education:   Postpartum depression  Hospital drug screen policy   If child protective services report - county:   If child protective services report - date:   Information/referral to community resources comment:   No  referrals needed at this time.   Other social work plan:   CSW to follow-up PRN. CSW to monitor MDS and will make CPS report if needed.

## 2014-07-03 NOTE — Discharge Instructions (Signed)

## 2014-07-03 NOTE — Discharge Summary (Signed)
Obstetric Discharge Summary Reason for Admission: onset of labor Prenatal Procedures: none Intrapartum Procedures: spontaneous vaginal delivery Postpartum Procedures: none Complications-Operative and Postpartum: 2nd degree perineal laceration HEMOGLOBIN  Date Value Ref Range Status  07/01/2014 12.0 12.0 - 15.0 g/dL Final   HCT  Date Value Ref Range Status  07/01/2014 34.0* 36.0 - 46.0 % Final    Physical Exam:  General: alert and cooperative Lochia: appropriate Uterine Fundus: firm Incision: n/a DVT Evaluation: No evidence of DVT seen on physical exam.  Discharge Diagnoses: Term Pregnancy-delivered  Discharge Information: Date: 07/03/2014 Activity: pelvic rest Diet: routine Medications: PNV and Ibuprofen Condition: stable Instructions: refer to practice specific booklet Discharge to: home Follow-up Information    [facility]. Schedule an appointment as soon as possible for a visit in 5 weeks.   Specialty:  Obstetrics and Gynecology   Why:  Postpartum follow-up   Contact information:   [address] [place] [id number] [phone]      Newborn Data: Live born female  Birth Weight: 6 lb 4.4 oz (2846 g) APGAR: 9, 9  Home with mother.  Jacquelin Hawkingettey, Catheryne Deford 07/03/2014, 7:49 AM

## 2014-08-06 ENCOUNTER — Encounter: Payer: Self-pay | Admitting: *Deleted

## 2014-08-17 ENCOUNTER — Ambulatory Visit (INDEPENDENT_AMBULATORY_CARE_PROVIDER_SITE_OTHER): Payer: Medicaid Other | Admitting: Obstetrics & Gynecology

## 2014-08-17 ENCOUNTER — Encounter: Payer: Self-pay | Admitting: Obstetrics & Gynecology

## 2014-08-17 VITALS — BP 125/80 | HR 81 | Wt 151.0 lb

## 2014-08-17 DIAGNOSIS — Z30018 Encounter for initial prescription of other contraceptives: Secondary | ICD-10-CM

## 2014-08-17 MED ORDER — MEDROXYPROGESTERONE ACETATE 150 MG/ML IM SUSP
150.0000 mg | Freq: Once | INTRAMUSCULAR | Status: AC
  Administered 2014-08-17: 150 mg via INTRAMUSCULAR

## 2014-08-17 NOTE — Progress Notes (Signed)
Patient ID: Michele Torres, female   DOB: [birth year], 24 y.o.   MRN: [id number] Subjective:     Michele Torres is a 24 y.o. female who presents for a postpartum visit. She is 6 weeks postpartum following a spontaneous vaginal delivery. I have fully reviewed the prenatal and intrapartum course. The delivery was at term. Outcome: spontaneous vaginal delivery. Anesthesia: epidural. Postpartum course has been uncomplicated. Baby's course has been unremarkable. Baby is feeding by bottle. Bleeding no bleeding. Bowel function is normal. Bladder function is normal. Patient is not sexually active. Contraception method is abstinence. Postpartum depression screening: negative.  The following portions of the patient's history were reviewed and updated as appropriate: allergies, current medications, past family history, past medical history, past social history, past surgical history and problem list.  Review of Systems A comprehensive review of systems was negative.   Objective:    BP 125/80 mmHg  Pulse 81  Wt 151 lb (68.493 kg)  LMP 08/10/2014  Breastfeeding? No  General:  alert and no distress           Abdomen: soft, non-tender; bowel sounds normal; no masses,  no organomegaly   Vulva:  normal  Vagina: normal vagina, no discharge, exudate, lesion, or erythema           Rectal Exam: Not performed.        Assessment:     6 week postpartum exam. Pap smear not done at today's visit.   Plan:    1. Contraception: abstinence 2. Desires Depo provera 150 mg IM x 1 and q 3 months  3. Follow up in: 3 months or as needed.

## 2014-11-16 ENCOUNTER — Ambulatory Visit (INDEPENDENT_AMBULATORY_CARE_PROVIDER_SITE_OTHER): Payer: Medicaid Other | Admitting: *Deleted

## 2014-11-16 DIAGNOSIS — Z3042 Encounter for surveillance of injectable contraceptive: Secondary | ICD-10-CM | POA: Diagnosis not present

## 2014-11-16 MED ORDER — MEDROXYPROGESTERONE ACETATE 150 MG/ML IM SUSP
150.0000 mg | Freq: Once | INTRAMUSCULAR | Status: AC
  Administered 2014-11-16: 150 mg via INTRAMUSCULAR

## 2014-11-16 NOTE — Progress Notes (Signed)
Pt here today for a Depo Provera 

## 2015-02-13 ENCOUNTER — Ambulatory Visit: Payer: Medicaid Other

## 2015-02-18 ENCOUNTER — Ambulatory Visit (INDEPENDENT_AMBULATORY_CARE_PROVIDER_SITE_OTHER): Payer: Medicaid Other | Admitting: *Deleted

## 2015-02-18 DIAGNOSIS — Z3042 Encounter for surveillance of injectable contraceptive: Secondary | ICD-10-CM

## 2015-02-18 DIAGNOSIS — Z01812 Encounter for preprocedural laboratory examination: Secondary | ICD-10-CM | POA: Diagnosis not present

## 2015-02-18 LAB — POCT URINE PREGNANCY: PREG TEST UR: NEGATIVE

## 2015-02-18 MED ORDER — MEDROXYPROGESTERONE ACETATE 150 MG/ML IM SUSP
150.0000 mg | INTRAMUSCULAR | Status: AC
  Administered 2015-02-18: 150 mg via INTRAMUSCULAR

## 2015-05-20 ENCOUNTER — Ambulatory Visit: Payer: Medicaid Other

## 2015-05-31 ENCOUNTER — Ambulatory Visit: Payer: Medicaid Other

## 2017-09-07 ENCOUNTER — Emergency Department (HOSPITAL_COMMUNITY)
Admission: EM | Admit: 2017-09-07 | Discharge: 2017-09-07 | Disposition: A | Discharge status: Home / Self Care | Payer: Self-pay | Attending: Emergency Medicine | Admitting: Emergency Medicine

## 2017-09-07 ENCOUNTER — Encounter (HOSPITAL_COMMUNITY): Payer: Self-pay

## 2017-09-07 ENCOUNTER — Other Ambulatory Visit: Payer: Self-pay

## 2017-09-07 DIAGNOSIS — Z3A00 Weeks of gestation of pregnancy not specified: Secondary | ICD-10-CM | POA: Insufficient documentation

## 2017-09-07 DIAGNOSIS — O9989 Other specified diseases and conditions complicating pregnancy, childbirth and the puerperium: Secondary | ICD-10-CM | POA: Insufficient documentation

## 2017-09-07 DIAGNOSIS — R110 Nausea: Secondary | ICD-10-CM | POA: Insufficient documentation

## 2017-09-07 DIAGNOSIS — Z3490 Encounter for supervision of normal pregnancy, unspecified, unspecified trimester: Secondary | ICD-10-CM

## 2017-09-07 LAB — URINALYSIS, ROUTINE W REFLEX MICROSCOPIC
Bilirubin Urine: NEGATIVE
GLUCOSE, UA: NEGATIVE mg/dL
Hgb urine dipstick: NEGATIVE
Ketones, ur: NEGATIVE mg/dL
Nitrite: NEGATIVE
PH: 7 (ref 5.0–8.0)
Protein, ur: NEGATIVE mg/dL
SPECIFIC GRAVITY, URINE: 1.016 (ref 1.005–1.030)

## 2017-09-07 LAB — POC URINE PREG, ED: Preg Test, Ur: POSITIVE — AB

## 2017-09-07 NOTE — ED Provider Notes (Signed)
Patient placed in Quick Look pathway, seen and evaluated   Chief Complaint: nausea  HPI:   27 y/o female w/ nausea x 3 days No fever, abd pain  lmp in decemeber Nothing makes her sx better ot worse   ROS: no vaginal bleeding (one)  Physical Exam:   Gen: No distress  Neuro: Awake and Alert  Skin: Warm    Focused Exam: abd non distended   Initiation of care has begun. The patient has been counseled on the process, plan, and necessity for staying for the completion/evaluation, and the remainder of the medical screening examination   Lorre NickAllen, Natassia Guthridge, MD 09/07/17 1341

## 2017-09-07 NOTE — ED Provider Notes (Addendum)
[facility]-EMERGENCY DEPT Provider Note   CSN: [id number] Arrival date & time: 09/07/17  1320     History   Chief Complaint Chief Complaint  Patient presents with  . Nausea    HPI Michele Torres is a 27 y.o. female.  27 year old female presents with nausea times several days.  Denies any abdominal pain.  She has had no fever or emesis.  Last menstrual period was back in December.  Nausea waxes and wanes and nothing makes it better or worse.  Does need to be somewhat worse in the morning actually.  No treatment used prior to arrival.      Past Medical History:  Diagnosis Date  . Medical history non-contributory     Patient Active Problem List   Diagnosis Date Noted  . Active labor at term 07/01/2014  . Group B Streptococcus urinary tract infection at 36 weeks 06/15/2014  . Gonorrhea affecting pregnancy in third trimester, antepartum 06/14/2014  . Small for dates affecting management of mother   . Insufficient prenatal care   . Insufficient prenatal care in third trimester 06/12/2014    Past Surgical History:  Procedure Laterality Date  . NO PAST SURGERIES      OB History    Gravida Para Term Preterm AB Living   1 1 1  0 0 1   SAB TAB Ectopic Multiple Live Births   0 0 0 0 1       Home Medications    Prior to Admission medications   Medication Sig Start Date End Date Taking? Authorizing Provider  Prenatal Vit-Fe Fumarate-FA (PRENATAL MULTIVITAMIN) TABS tablet Take 1 tablet by mouth daily.     [provider]    Family History Family History  Problem Relation Age of Onset  . Mental illness Maternal Grandmother        alzheimers    Social History Social History   Tobacco Use  . Smoking status: Never Smoker  . Smokeless tobacco: Never Used  Substance Use Topics  . Alcohol use: No    Alcohol/week: 0.0 oz  . Drug use: No     Allergies   Patient has no known allergies.   Review of Systems Review of Systems    All other systems reviewed and are negative.    Physical Exam Updated Vital Signs BP 127/74 (BP Location: Right Arm)   Pulse 75   Temp 98.6 F (37 C) (Oral)   Resp 16   Ht 1.702 m (5\' 7" )   Wt 58.3 kg (128 lb 9 oz)   SpO2 100%   BMI 20.14 kg/m   Physical Exam  Constitutional: She is oriented to person, place, and time. She appears well-developed and well-nourished.  Non-toxic appearance. No distress.  HENT:  Head: Normocephalic and atraumatic.  Eyes: Conjunctivae, EOM and lids are normal. Pupils are equal, round, and reactive to light.  Neck: Normal range of motion. Neck supple. No tracheal deviation present. No thyroid mass present.  Cardiovascular: Normal rate, regular rhythm and normal heart sounds. Exam reveals no gallop.  No murmur heard. Pulmonary/Chest: Effort normal and breath sounds normal. No stridor. No respiratory distress. She has no decreased breath sounds. She has no wheezes. She has no rhonchi. She has no rales.  Abdominal: Soft. Normal appearance and bowel sounds are normal. She exhibits no distension. There is no tenderness. There is no rebound and no CVA tenderness.  Musculoskeletal: Normal range of motion. She exhibits no edema or tenderness.  Neurological: She is alert  and oriented to person, place, and time. She has normal strength. No cranial nerve deficit or sensory deficit. GCS eye subscore is 4. GCS verbal subscore is 5. GCS motor subscore is 6.  Skin: Skin is warm and dry. No abrasion and no rash noted.  Psychiatric: She has a normal mood and affect. Her speech is normal and behavior is normal.  Nursing note and vitals reviewed.    ED Treatments / Results  Labs (all labs ordered are listed, but only abnormal results are displayed) Labs Reviewed  POC URINE PREG, ED - Abnormal; Notable for the following components:      Result Value   Preg Test, Ur POSITIVE (*)    All other components within normal limits  URINALYSIS, ROUTINE W REFLEX MICROSCOPIC     EKG  EKG Interpretation None       Radiology No results found.  Procedures Procedures (including critical care time)  Medications Ordered in ED Medications - No data to display   Initial Impression / Assessment and Plan / ED Course  I have reviewed the triage vital signs and the nursing notes.  Pertinent labs & imaging results that were available during my care of the patient were reviewed by me and considered in my medical decision making (see chart for details).     Patient has no abdominal pain at this time.  Has a positive pregnancy test.  She is able to keep down ginger ale.  No concern for ectopic pregnancy.  Will follow up with her gynecologist.  Urinalysis negative  Final Clinical Impressions(s) / ED Diagnoses   Final diagnoses:  None    ED Discharge Orders    None       Lorre NickAllen, Aaron Bostwick, MD 09/07/17 1453    Lorre NickAllen, Joaovictor Krone, MD 09/07/17 1456

## 2017-09-07 NOTE — ED Triage Notes (Signed)
Pt complaining of nausea, diarrhea and hot flashes x4 days. No emesis or fever. Denies urinary symptoms. Denies abdominal pain. LMP 07/29/17.

## 2017-09-09 ENCOUNTER — Telehealth: Payer: Self-pay | Admitting: General Practice

## 2017-09-09 DIAGNOSIS — O219 Vomiting of pregnancy, unspecified: Secondary | ICD-10-CM

## 2017-09-09 MED ORDER — PROMETHAZINE HCL 25 MG PO TABS: 25.0000 mg | ORAL_TABLET | Freq: Four times a day (QID) | ORAL | 0 refills | Status: AC | PRN

## 2017-09-09 NOTE — Telephone Encounter (Signed)
Patient called into front office requesting suggestions for nausea medication. Med ordered per Dr Debroah LoopArnold & patient informed. Patient verbalized understanding & had no questions

## 2017-09-15 ENCOUNTER — Encounter: Payer: Self-pay | Admitting: Student

## 2017-09-29 ENCOUNTER — Other Ambulatory Visit: Payer: Self-pay | Admitting: *Deleted

## 2017-09-29 ENCOUNTER — Encounter: Payer: Self-pay | Admitting: Obstetrics and Gynecology

## 2017-09-29 ENCOUNTER — Encounter: Payer: Medicaid Other | Admitting: Obstetrics and Gynecology

## 2017-09-29 DIAGNOSIS — Z23 Encounter for immunization: Secondary | ICD-10-CM

## 2017-09-29 DIAGNOSIS — Z3480 Encounter for supervision of other normal pregnancy, unspecified trimester: Secondary | ICD-10-CM

## 2017-09-29 NOTE — Progress Notes (Signed)
Patient did not keep New OB appointment for 09/29/2017.  Michele Torres, Jr MD Attending [facility] Mid[facility](Faculty Practice)
